# Patient Record
Sex: Female | Born: 1955 | Race: White | Hispanic: No | Smoking: Current every day smoker
Health system: Southern US, Community
[De-identification: ages and names within clinical notes are randomized; demographics above are authoritative.]

## PROBLEM LIST (undated history)

## (undated) DIAGNOSIS — Z923 Personal history of irradiation: Secondary | ICD-10-CM

## (undated) DIAGNOSIS — Z86000 Personal history of in-situ neoplasm of breast: Secondary | ICD-10-CM

## (undated) DIAGNOSIS — F419 Anxiety disorder, unspecified: Secondary | ICD-10-CM

## (undated) DIAGNOSIS — J342 Deviated nasal septum: Secondary | ICD-10-CM

## (undated) DIAGNOSIS — M48061 Spinal stenosis, lumbar region without neurogenic claudication: Secondary | ICD-10-CM

## (undated) DIAGNOSIS — J324 Chronic pansinusitis: Secondary | ICD-10-CM

## (undated) DIAGNOSIS — I10 Essential (primary) hypertension: Secondary | ICD-10-CM

## (undated) DIAGNOSIS — K5790 Diverticulosis of intestine, part unspecified, without perforation or abscess without bleeding: Secondary | ICD-10-CM

## (undated) DIAGNOSIS — K219 Gastro-esophageal reflux disease without esophagitis: Secondary | ICD-10-CM

## (undated) DIAGNOSIS — Z800 Family history of malignant neoplasm of digestive organs: Secondary | ICD-10-CM

## (undated) DIAGNOSIS — G2581 Restless legs syndrome: Secondary | ICD-10-CM

## (undated) HISTORY — DX: Family history of malignant neoplasm of digestive organs: Z80.0

## (undated) HISTORY — DX: Essential (primary) hypertension: I10

## (undated) HISTORY — DX: Anxiety disorder, unspecified: F41.9

## (undated) HISTORY — DX: Personal history of in-situ neoplasm of breast: Z86.000

## (undated) HISTORY — DX: Gastro-esophageal reflux disease without esophagitis: K21.9

## (undated) HISTORY — DX: Deviated nasal septum: J34.2

## (undated) HISTORY — DX: Spinal stenosis, lumbar region without neurogenic claudication: M48.061

## (undated) HISTORY — DX: Chronic pansinusitis: J32.4

## (undated) HISTORY — DX: Diverticulosis of intestine, part unspecified, without perforation or abscess without bleeding: K57.90

## (undated) HISTORY — PX: WISDOM TOOTH EXTRACTION: SHX21

## (undated) HISTORY — DX: Restless legs syndrome: G25.81

---

## 2007-01-22 ENCOUNTER — Encounter: Admission: RE | Admit: 2007-01-22 | Discharge: 2007-01-22 | Payer: Self-pay | Admitting: Unknown Physician Specialty

## 2010-10-23 ENCOUNTER — Encounter: Payer: Self-pay | Admitting: Unknown Physician Specialty

## 2014-07-14 DIAGNOSIS — G2581 Restless legs syndrome: Secondary | ICD-10-CM | POA: Insufficient documentation

## 2014-07-14 DIAGNOSIS — G4700 Insomnia, unspecified: Secondary | ICD-10-CM | POA: Insufficient documentation

## 2014-07-14 DIAGNOSIS — K219 Gastro-esophageal reflux disease without esophagitis: Secondary | ICD-10-CM | POA: Insufficient documentation

## 2014-07-14 DIAGNOSIS — J342 Deviated nasal septum: Secondary | ICD-10-CM | POA: Insufficient documentation

## 2014-10-29 ENCOUNTER — Other Ambulatory Visit: Payer: Self-pay

## 2014-10-29 DIAGNOSIS — Z1231 Encounter for screening mammogram for malignant neoplasm of breast: Secondary | ICD-10-CM

## 2014-11-04 ENCOUNTER — Ambulatory Visit: Payer: Self-pay

## 2014-11-11 ENCOUNTER — Ambulatory Visit
Admission: RE | Admit: 2014-11-11 | Discharge: 2014-11-11 | Disposition: A | Discharge status: Home / Self Care | Payer: 59 | Source: Ambulatory Visit

## 2014-11-11 DIAGNOSIS — Z1231 Encounter for screening mammogram for malignant neoplasm of breast: Secondary | ICD-10-CM

## 2015-11-04 DIAGNOSIS — I10 Essential (primary) hypertension: Secondary | ICD-10-CM | POA: Insufficient documentation

## 2016-03-27 ENCOUNTER — Encounter: Payer: Self-pay | Admitting: Physician Assistant

## 2016-03-27 ENCOUNTER — Ambulatory Visit (INDEPENDENT_AMBULATORY_CARE_PROVIDER_SITE_OTHER): Payer: BLUE CROSS/BLUE SHIELD | Admitting: Physician Assistant

## 2016-03-27 VITALS — BP 132/78 | HR 57 | Temp 97.8°F | Ht 62.0 in | Wt 163.8 lb

## 2016-03-27 DIAGNOSIS — J0100 Acute maxillary sinusitis, unspecified: Secondary | ICD-10-CM | POA: Diagnosis not present

## 2016-03-27 MED ORDER — AMOXICILLIN-POT CLAVULANATE 875-125 MG PO TABS: 1.0000 | ORAL_TABLET | Freq: Two times a day (BID) | ORAL | Status: DC

## 2016-03-27 NOTE — Patient Instructions (Signed)

## 2016-03-27 NOTE — Progress Notes (Signed)
Subjective:     Patient ID: Cathy Goodwin, female   DOB: 1956/09/14, 60 y.o.   MRN: [id number]  HPI Pt with sinus pain and pressure for 2 weeks Uses Flonase in Sinu-Rinse on a regular basis  Review of Systems  Constitutional: Negative.   HENT: Positive for congestion, postnasal drip and sinus pressure. Negative for ear discharge, ear pain, nosebleeds, rhinorrhea, sneezing, sore throat, trouble swallowing and voice change.   Respiratory: Positive for cough. Negative for shortness of breath and wheezing.        Objective:   Physical Exam  Constitutional: She appears well-developed and well-nourished.  HENT:  Right Ear: External ear normal.  Left Ear: External ear normal.  Mouth/Throat: Oropharynx is clear and moist. No oropharyngeal exudate.  + frontal and maxillary sinus TTP  Neck: Neck supple.  Cardiovascular: Normal rate, regular rhythm and normal heart sounds.   Pulmonary/Chest: Effort normal and breath sounds normal. No respiratory distress. She has no wheezes. She has no rales. She exhibits no tenderness.  Lymphadenopathy:    She has no cervical adenopathy.  Nursing note and vitals reviewed.      Assessment:     1. Acute maxillary sinusitis, recurrence not specified        Plan:     Augmentin bid x 2 weeks since more chronic in nature Can continue with Flonase and saline rinse Fluids Rest F/U prn

## 2016-04-11 ENCOUNTER — Telehealth: Payer: Self-pay | Admitting: Physician Assistant

## 2016-04-12 ENCOUNTER — Ambulatory Visit (INDEPENDENT_AMBULATORY_CARE_PROVIDER_SITE_OTHER): Payer: BLUE CROSS/BLUE SHIELD | Admitting: Pediatrics

## 2016-04-12 ENCOUNTER — Encounter: Payer: Self-pay | Admitting: Pediatrics

## 2016-04-12 VITALS — BP 131/86 | HR 76 | Temp 97.1°F | Ht 62.0 in | Wt 160.8 lb

## 2016-04-12 DIAGNOSIS — I10 Essential (primary) hypertension: Secondary | ICD-10-CM | POA: Diagnosis not present

## 2016-04-12 DIAGNOSIS — J329 Chronic sinusitis, unspecified: Secondary | ICD-10-CM | POA: Diagnosis not present

## 2016-04-12 DIAGNOSIS — Z6829 Body mass index (BMI) 29.0-29.9, adult: Secondary | ICD-10-CM | POA: Diagnosis not present

## 2016-04-12 MED ORDER — FLUTICASONE PROPIONATE 50 MCG/ACT NA SUSP: 1.0000 | Freq: Two times a day (BID) | NASAL | Status: DC

## 2016-04-12 MED ORDER — LEVOFLOXACIN 500 MG PO TABS: 500.0000 mg | ORAL_TABLET | Freq: Every day | ORAL | Status: DC

## 2016-04-12 MED ORDER — AZELASTINE HCL 0.15 % NA SOLN: 2.0000 | Freq: Two times a day (BID) | NASAL | Status: DC

## 2016-04-12 NOTE — Patient Instructions (Signed)
Azelastine spray twice a day Flonase twice a day Take levofloxacin once a day for 7 days Use sinus rinses at least twice a day Use nose sprays AFTER sinus rinses

## 2016-04-12 NOTE — Progress Notes (Signed)
    Subjective:    Patient ID: Cathy Goodwin, female    DOB: 04/10/1956, 60 y.o.   MRN: [id number]  CC: Nasal Congestion; Sinus pressure; and Fatigue   HPI: Cathy Goodwin is a 60 y.o. female presenting for Nasal Congestion; Sinus pressure; and Fatigue  Just finished two weeks of augmentin for chronic sinusitis Denies fevers Abx helped at first, now pressure in sinuses is back Doing sinus rinses 1-2 times a day Now nothing seems to help with pain Pain worse when she leans forwards dymista spray has helped in the past Ear pressure comes and goes No sore throat Appetite normal   Depression screen [facility] 2/9 04/12/2016 03/27/2016  Decreased Interest 1 0  Down, Depressed, Hopeless 0 1  PHQ - 2 Score 1 1     Relevant past medical, surgical, family and social history reviewed and updated as indicated.  Interim medical history since our last visit reviewed. Allergies and medications reviewed and updated.  ROS: Per HPI unless specifically indicated above  History  Smoking status  . Not on file  Smokeless tobacco  . Not on file       Objective:    BP 131/86 mmHg  Pulse 76  Temp(Src) 97.1 F (36.2 C) (Oral)  Ht 5' 2"  (1.575 m)  Wt 160 lb 12.8 oz (72.938 kg)  BMI 29.40 kg/m2  Wt Readings from Last 3 Encounters:  04/12/16 160 lb 12.8 oz (72.938 kg)  03/27/16 163 lb 12.8 oz (74.299 kg)    Gen: NAD, alert, cooperative with exam, NCAT EYES: EOMI, no scleral injection or icterus ENT:  TMs pearly gray b/l, OP without erythema, tender over max and frontal sinuses b/l LYMPH: no cervical LAD CV: NRRR, normal S1/S2, no murmur, distal pulses 2+ b/l Resp: CTABL, no wheezes, normal WOB Ext: No edema, warm Neuro: Alert and oriented     Assessment & Plan:    Lessa was seen today for nasal congestion, sinus pressure and fatigue.  Diagnoses and all orders for this visit:  Recurrent sinusitis -     levofloxacin (LEVAQUIN) 500 MG tablet; Take 1 tablet (500 mg total) by mouth  daily. -     Azelastine HCl 0.15 % SOLN; Place 2 sprays into the nose 2 (two) times daily. -     fluticasone (FLONASE) 50 MCG/ACT nasal spray; Place 1 spray into both nostrils 2 (two) times daily.  BMI 29.0-29.9,adult  Essential (primary) hypertension Adequate control, cont current meds  Patient Instructions Azelastine spray twice a day Flonase twice a day Take levofloxacin once a day for 7 days Use sinus rinses at least twice a day Use nose sprays AFTER sinus rinses  Follow up plan: Return in about 1 week (around 04/19/2016), or if symptoms worsen or fail to improve. consider imaging  Assunta Found, MD [facility] 04/12/2016, 2:07 PM

## 2016-04-20 ENCOUNTER — Ambulatory Visit: Payer: BLUE CROSS/BLUE SHIELD | Admitting: Pediatrics

## 2016-11-21 ENCOUNTER — Emergency Department (HOSPITAL_BASED_OUTPATIENT_CLINIC_OR_DEPARTMENT_OTHER)
Admission: EM | Admit: 2016-11-21 | Discharge: 2016-11-21 | Disposition: A | Discharge status: Home / Self Care | Payer: BLUE CROSS/BLUE SHIELD | Attending: Emergency Medicine | Admitting: Emergency Medicine

## 2016-11-21 ENCOUNTER — Encounter (HOSPITAL_BASED_OUTPATIENT_CLINIC_OR_DEPARTMENT_OTHER): Payer: Self-pay

## 2016-11-21 DIAGNOSIS — Z79899 Other long term (current) drug therapy: Secondary | ICD-10-CM | POA: Insufficient documentation

## 2016-11-21 DIAGNOSIS — M545 Low back pain, unspecified: Secondary | ICD-10-CM

## 2016-11-21 DIAGNOSIS — I10 Essential (primary) hypertension: Secondary | ICD-10-CM | POA: Diagnosis not present

## 2016-11-21 DIAGNOSIS — R109 Unspecified abdominal pain: Secondary | ICD-10-CM | POA: Insufficient documentation

## 2016-11-21 DIAGNOSIS — F17210 Nicotine dependence, cigarettes, uncomplicated: Secondary | ICD-10-CM | POA: Insufficient documentation

## 2016-11-21 LAB — URINALYSIS, ROUTINE W REFLEX MICROSCOPIC
Bilirubin Urine: NEGATIVE
GLUCOSE, UA: NEGATIVE mg/dL
HGB URINE DIPSTICK: NEGATIVE
KETONES UR: NEGATIVE mg/dL
Leukocytes, UA: NEGATIVE
Nitrite: NEGATIVE
PROTEIN: NEGATIVE mg/dL
Specific Gravity, Urine: 1.019 (ref 1.005–1.030)
pH: 7.5 (ref 5.0–8.0)

## 2016-11-21 MED ORDER — BACLOFEN 10 MG PO TABS: 10.0000 mg | ORAL_TABLET | Freq: Three times a day (TID) | ORAL | 0 refills | Status: DC

## 2016-11-21 MED ORDER — MELOXICAM 15 MG PO TABS: 15.0000 mg | ORAL_TABLET | Freq: Every day | ORAL | 0 refills | Status: DC

## 2016-11-21 NOTE — ED Provider Notes (Signed)
[facility] Provider Note   CSN: [id number] Arrival date & time: 11/21/16  1351   By signing my name below, I, Evelene Croon, attest that this documentation has been prepared under the direction and in the presence of Margarita Mail, PA-C. Electronically Signed: Evelene Croon, Scribe. 11/21/2016. 4:50 PM.  History   Chief Complaint Chief Complaint  Patient presents with  . Back Pain    The history is provided by the patient. No language interpreter was used.     HPI Comments:  Cathy Goodwin is a 61 y.o. female with a h/o back pain  who presents to the Emergency Department complaining of 6/10 burning pain from left back region around to abdomen since yesterday. Her pain waxes and wanes in severity  She denies h/o kidney stones Pain today is dissimilar to her usual back pain.  No modifying factors. No urinary symptoms or fever.  Past Medical History:  Diagnosis Date  . Hypertension     Patient Active Problem List   Diagnosis Date Noted  . BMI 29.0-29.9,adult 04/12/2016  . Essential (primary) hypertension 11/04/2015  . Restless leg 07/14/2014  . Cannot sleep 07/14/2014  . Acid reflux 07/14/2014  . Deflected nasal septum 07/14/2014    Past Surgical History:  Procedure Laterality Date  . WISDOM TOOTH EXTRACTION      OB History    No data available       Home Medications    Prior to Admission medications   Medication Sig Start Date End Date Taking? Authorizing Provider  ALPRAZolam Duanne Moron) 0.5 MG tablet Take 0.5 mg by mouth as needed for anxiety.   Yes Historical Provider, MD  losartan-hydrochlorothiazide (HYZAAR) 50-12.5 MG tablet Take 1 tablet by mouth daily.   Yes Historical Provider, MD  zolpidem (AMBIEN) 10 MG tablet Take 10 mg by mouth at bedtime as needed for sleep.   Yes Historical Provider, MD  Azelastine HCl 0.15 % SOLN Place 2 sprays into the nose 2 (two) times daily. 04/12/16   Eustaquio Maize, MD  fluticasone (FLONASE) 50 MCG/ACT nasal  spray Place 1 spray into both nostrils 2 (two) times daily. 04/12/16   Eustaquio Maize, MD  levofloxacin (LEVAQUIN) 500 MG tablet Take 1 tablet (500 mg total) by mouth daily. 04/12/16   Eustaquio Maize, MD    Family History Family History  Problem Relation Age of Onset  . Heart disease Mother   . Cancer Father     colon, prostate  . Heart disease Father   . Hypertension Sister     Social History Social History  Substance Use Topics  . Smoking status: Current Every Day Smoker    Packs/day: 0.50    Types: Cigarettes  . Smokeless tobacco: Never Used  . Alcohol use Yes     Comment: occ     Allergies   Patient has no known allergies.   Review of Systems Review of Systems  Constitutional: Negative for fever.  Gastrointestinal: Positive for abdominal pain.  Genitourinary: Negative for dysuria and hematuria.  Musculoskeletal: Positive for back pain.     Physical Exam Updated Vital Signs BP 148/81 (BP Location: Right Arm)   Pulse 79   Temp 98.3 F (36.8 C) (Oral)   Resp 20   Ht 5' 1"  (1.549 m)   Wt 160 lb (72.6 kg)   SpO2 100%   BMI 30.23 kg/m   Physical Exam  Constitutional: She is oriented to person, place, and time. She appears well-developed and well-nourished. No distress.  HENT:  Head: Normocephalic and atraumatic.  Eyes: Conjunctivae are normal.  Cardiovascular: Normal rate.   Pulmonary/Chest: Effort normal.  Abdominal: She exhibits no distension.  No CVA tenderness  Musculoskeletal:  Tenderness to left paraspinal muscles worse with twisting movement  Normal strength and sensation in the extremities   Neurological: She is alert and oriented to person, place, and time.  Skin: Skin is warm and dry.  Psychiatric: She has a normal mood and affect.  Nursing note and vitals reviewed.    ED Treatments / Results  DIAGNOSTIC STUDIES:  Oxygen Saturation is 100% on RA, normal by my interpretation.    COORDINATION OF CARE:  4:48 PM Discussed treatment  plan with pt at bedside and pt agreed to plan.  Labs (all labs ordered are listed, but only abnormal results are displayed) Labs Reviewed - No data to display  EKG  EKG Interpretation None       Radiology No results found.  Procedures Procedures (including critical care time)  Medications Ordered in ED Medications - No data to display   Initial Impression / Assessment and Plan / ED Course  I have reviewed the triage vital signs and the nursing notes.  Pertinent labs & imaging results that were available during my care of the patient were reviewed by me and considered in my medical decision making (see chart for details).     Patient with back pain.  No neurological deficits and normal neuro exam.  Patient can walk but states is painful.  No loss of bowel or bladder control.  No concern for cauda equina.  No fever, night sweats, weight loss, h/o cancer, IVDU.  RICE protocol and pain medicine indicated and discussed with patient.    Final Clinical Impressions(s) / ED Diagnoses   Final diagnoses:  Acute bilateral low back pain without sciatica    New Prescriptions New Prescriptions   No medications on file   I personally performed the services described in this documentation, which was scribed in my presence. The recorded information has been reviewed and is accurate.        Margarita Mail, PA-C 11/21/16 1953    Dorie Rank, MD 11/23/16 2056

## 2016-11-21 NOTE — ED Triage Notes (Addendum)
CHARTED ON THE WRONG PERSON

## 2016-11-21 NOTE — ED Triage Notes (Signed)
Presents with lower back pain that started a couple days ago.  Denies urinary symptoms.

## 2016-11-21 NOTE — Discharge Instructions (Signed)
YOUR URINE IS NEGATIVE.  SEEK IMMEDIATE MEDICAL ATTENTION IF: New numbness, tingling, weakness, or problem with the use of your arms or legs.  Severe back pain not relieved with medications.  Change in bowel or bladder control.  Increasing pain in any areas of the body (such as chest or abdominal pain).  Shortness of breath, dizziness or fainting.  Nausea (feeling sick to your stomach), vomiting, fever, or sweats.

## 2016-11-22 ENCOUNTER — Ambulatory Visit (INDEPENDENT_AMBULATORY_CARE_PROVIDER_SITE_OTHER): Payer: BLUE CROSS/BLUE SHIELD

## 2016-11-22 ENCOUNTER — Other Ambulatory Visit: Payer: Self-pay | Admitting: Physician Assistant

## 2016-11-22 ENCOUNTER — Encounter: Payer: Self-pay | Admitting: Physician Assistant

## 2016-11-22 ENCOUNTER — Ambulatory Visit (INDEPENDENT_AMBULATORY_CARE_PROVIDER_SITE_OTHER): Payer: BLUE CROSS/BLUE SHIELD | Admitting: Physician Assistant

## 2016-11-22 VITALS — BP 120/76 | HR 76 | Temp 98.0°F | Ht 61.0 in | Wt 158.0 lb

## 2016-11-22 DIAGNOSIS — M545 Low back pain, unspecified: Secondary | ICD-10-CM

## 2016-11-22 MED ORDER — METHYLPREDNISOLONE ACETATE 80 MG/ML IJ SUSP
80.0000 mg | Freq: Once | INTRAMUSCULAR | Status: AC
  Administered 2016-11-22: 80 mg via INTRAMUSCULAR

## 2016-11-22 NOTE — Patient Instructions (Addendum)
Please work on back exercises in a few days/  In a few days you may receive a survey in the mail or online from [employer] regarding your visit with [place] today. Please take a moment to fill this out. Your feedback is very important to our whole office. It can help [place] better understand your needs as well as improve your experience and satisfaction. Thank you for taking your time to complete it. We care about you.  Particia Nearing, PA-C

## 2016-11-22 NOTE — Progress Notes (Signed)
BP 120/76   Pulse 76   Temp 98 F (36.7 C) (Oral)   Ht 5' 1"  (1.549 m)   Wt 158 lb (71.7 kg)   BMI 29.85 kg/m    Subjective:    Patient ID: Cathy Goodwin, female    DOB: [birth year], 61 y.o.   MRN: [id number]  HPI: Cathy Goodwin is a 61 y.o. female presenting on 11/22/2016 for Back Pain (low back pain hurts all the way through)  Four days ago began central lumbar pain that has no radiation. Pain with movement, extension or flexion. Denies urine or bladder symptoms. Was seen through the ED yesterday and urinalysis was good. Denies fever or chills.OTC meds with no relief.  Relevant past medical, surgical, family and social history reviewed and updated as indicated. Allergies and medications reviewed and updated.  Past Medical History:  Diagnosis Date  . Hypertension     Past Surgical History:  Procedure Laterality Date  . WISDOM TOOTH EXTRACTION      Review of Systems  Constitutional: Negative.   HENT: Negative.   Eyes: Negative.   Respiratory: Negative.   Gastrointestinal: Negative.   Genitourinary: Negative.   Musculoskeletal: Positive for back pain and myalgias.    Allergies as of 11/22/2016   No Known Allergies     Medication List       Accurate as of 11/22/16  9:47 PM. Always use your most recent med list.          ALPRAZolam 0.5 MG tablet Commonly known as:  XANAX Take 0.5 mg by mouth as needed for anxiety.   baclofen 10 MG tablet Commonly known as:  LIORESAL Take 1 tablet (10 mg total) by mouth 3 (three) times daily.   fluticasone 50 MCG/ACT nasal spray Commonly known as:  FLONASE Place 1 spray into both nostrils 2 (two) times daily.   losartan-hydrochlorothiazide 50-12.5 MG tablet Commonly known as:  HYZAAR Take 1 tablet by mouth daily.   meloxicam 15 MG tablet Commonly known as:  MOBIC Take 1 tablet (15 mg total) by mouth daily. Take 1 daily with food.   zolpidem 10 MG tablet Commonly known as:  AMBIEN Take 10 mg by mouth at bedtime as  needed for sleep.          Objective:    BP 120/76   Pulse 76   Temp 98 F (36.7 C) (Oral)   Ht 5' 1"  (1.549 m)   Wt 158 lb (71.7 kg)   BMI 29.85 kg/m   No Known Allergies  Physical Exam  Constitutional: She is oriented to person, place, and time. She appears well-developed and well-nourished.  HENT:  Head: Normocephalic and atraumatic.  Eyes: Conjunctivae and EOM are normal. Pupils are equal, round, and reactive to light.  Cardiovascular: Normal rate, regular rhythm, normal heart sounds and intact distal pulses.   Pulmonary/Chest: Effort normal and breath sounds normal.  Abdominal: Soft. Bowel sounds are normal.  Musculoskeletal:       Lumbar back: She exhibits decreased range of motion, tenderness, pain and spasm.  Neurological: She is alert and oriented to person, place, and time. She has normal reflexes.  Skin: Skin is warm and dry. No rash noted.  Psychiatric: She has a normal mood and affect. Her behavior is normal. Judgment and thought content normal.    Results for orders placed or performed during the hospital encounter of 11/21/16  Urinalysis, Routine w reflex microscopic  Result Value Ref Range   Color, Urine YELLOW YELLOW  APPearance CLEAR CLEAR   Specific Gravity, Urine 1.019 1.005 - 1.030   pH 7.5 5.0 - 8.0   Glucose, UA NEGATIVE NEGATIVE mg/dL   Hgb urine dipstick NEGATIVE NEGATIVE   Bilirubin Urine NEGATIVE NEGATIVE   Ketones, ur NEGATIVE NEGATIVE mg/dL   Protein, ur NEGATIVE NEGATIVE mg/dL   Nitrite NEGATIVE NEGATIVE   Leukocytes, UA NEGATIVE NEGATIVE      Assessment & Plan:   1. Acute left-sided low back pain without sciatica - methylPREDNISolone acetate (DEPO-MEDROL) injection 80 mg; Inject 1 mL (80 mg total) into the muscle once. Continue mobic 15 mg Continue baclofen  Continue all other maintenance medications as listed above.  Follow up plan: Return if symptoms worsen or fail to improve.  Educational handout given for lumbar  pain  Terald Sleeper PA-C [place] [address]  [place], [place] [address] [phone]   11/22/2016, 9:47 PM

## 2017-01-30 ENCOUNTER — Other Ambulatory Visit: Payer: Self-pay | Admitting: Pediatrics

## 2017-01-30 DIAGNOSIS — J329 Chronic sinusitis, unspecified: Secondary | ICD-10-CM

## 2017-10-10 ENCOUNTER — Other Ambulatory Visit: Payer: Self-pay

## 2017-10-10 DIAGNOSIS — J329 Chronic sinusitis, unspecified: Secondary | ICD-10-CM

## 2017-10-10 MED ORDER — FLUTICASONE PROPIONATE 50 MCG/ACT NA SUSP: 1.0000 | Freq: Two times a day (BID) | NASAL | 2 refills | Status: AC

## 2018-12-20 LAB — HM HEPATITIS C SCREENING LAB: HM Hepatitis Screen: NEGATIVE

## 2019-07-03 HISTORY — PX: BREAST LUMPECTOMY: SHX2

## 2020-01-02 ENCOUNTER — Ambulatory Visit: Payer: 59

## 2020-01-03 ENCOUNTER — Ambulatory Visit: Payer: 59

## 2020-03-11 LAB — HM PAP SMEAR

## 2020-09-09 DIAGNOSIS — Z03818 Encounter for observation for suspected exposure to other biological agents ruled out: Secondary | ICD-10-CM | POA: Diagnosis not present

## 2020-09-09 DIAGNOSIS — Z20822 Contact with and (suspected) exposure to covid-19: Secondary | ICD-10-CM | POA: Diagnosis not present

## 2020-09-09 DIAGNOSIS — J0140 Acute pansinusitis, unspecified: Secondary | ICD-10-CM | POA: Diagnosis not present

## 2020-12-29 DIAGNOSIS — Z800 Family history of malignant neoplasm of digestive organs: Secondary | ICD-10-CM | POA: Diagnosis not present

## 2020-12-29 DIAGNOSIS — Z8371 Family history of colonic polyps: Secondary | ICD-10-CM | POA: Diagnosis not present

## 2020-12-29 DIAGNOSIS — R109 Unspecified abdominal pain: Secondary | ICD-10-CM | POA: Diagnosis not present

## 2020-12-29 DIAGNOSIS — K219 Gastro-esophageal reflux disease without esophagitis: Secondary | ICD-10-CM | POA: Diagnosis not present

## 2021-01-07 DIAGNOSIS — F5101 Primary insomnia: Secondary | ICD-10-CM | POA: Diagnosis not present

## 2021-01-07 DIAGNOSIS — I10 Essential (primary) hypertension: Secondary | ICD-10-CM | POA: Diagnosis not present

## 2021-01-07 DIAGNOSIS — M545 Low back pain, unspecified: Secondary | ICD-10-CM | POA: Diagnosis not present

## 2021-01-07 DIAGNOSIS — F17210 Nicotine dependence, cigarettes, uncomplicated: Secondary | ICD-10-CM | POA: Diagnosis not present

## 2021-01-07 DIAGNOSIS — M48061 Spinal stenosis, lumbar region without neurogenic claudication: Secondary | ICD-10-CM | POA: Diagnosis not present

## 2021-01-07 DIAGNOSIS — G2581 Restless legs syndrome: Secondary | ICD-10-CM | POA: Diagnosis not present

## 2021-01-07 DIAGNOSIS — E559 Vitamin D deficiency, unspecified: Secondary | ICD-10-CM | POA: Diagnosis not present

## 2021-01-07 DIAGNOSIS — G8929 Other chronic pain: Secondary | ICD-10-CM | POA: Diagnosis not present

## 2021-01-21 ENCOUNTER — Emergency Department (HOSPITAL_COMMUNITY): Payer: BC Managed Care – PPO

## 2021-01-21 ENCOUNTER — Encounter (HOSPITAL_COMMUNITY): Payer: Self-pay

## 2021-01-21 ENCOUNTER — Inpatient Hospital Stay (HOSPITAL_COMMUNITY)
Admission: EM | Admit: 2021-01-21 | Discharge: 2021-01-26 | DRG: 370 | Disposition: A | Discharge status: Home / Self Care | Payer: BC Managed Care – PPO | Attending: Internal Medicine | Admitting: Internal Medicine

## 2021-01-21 DIAGNOSIS — Z1211 Encounter for screening for malignant neoplasm of colon: Secondary | ICD-10-CM | POA: Diagnosis not present

## 2021-01-21 DIAGNOSIS — J982 Interstitial emphysema: Secondary | ICD-10-CM | POA: Diagnosis not present

## 2021-01-21 DIAGNOSIS — E876 Hypokalemia: Secondary | ICD-10-CM | POA: Diagnosis not present

## 2021-01-21 DIAGNOSIS — Z8249 Family history of ischemic heart disease and other diseases of the circulatory system: Secondary | ICD-10-CM | POA: Diagnosis not present

## 2021-01-21 DIAGNOSIS — I10 Essential (primary) hypertension: Secondary | ICD-10-CM | POA: Diagnosis present

## 2021-01-21 DIAGNOSIS — Z800 Family history of malignant neoplasm of digestive organs: Secondary | ICD-10-CM | POA: Diagnosis not present

## 2021-01-21 DIAGNOSIS — R600 Localized edema: Secondary | ICD-10-CM | POA: Diagnosis not present

## 2021-01-21 DIAGNOSIS — Z79899 Other long term (current) drug therapy: Secondary | ICD-10-CM

## 2021-01-21 DIAGNOSIS — K5730 Diverticulosis of large intestine without perforation or abscess without bleeding: Secondary | ICD-10-CM | POA: Diagnosis not present

## 2021-01-21 DIAGNOSIS — K223 Perforation of esophagus: Secondary | ICD-10-CM | POA: Diagnosis not present

## 2021-01-21 DIAGNOSIS — Z20822 Contact with and (suspected) exposure to covid-19: Secondary | ICD-10-CM | POA: Diagnosis present

## 2021-01-21 DIAGNOSIS — F17210 Nicotine dependence, cigarettes, uncomplicated: Secondary | ICD-10-CM | POA: Diagnosis present

## 2021-01-21 DIAGNOSIS — K219 Gastro-esophageal reflux disease without esophagitis: Secondary | ICD-10-CM | POA: Diagnosis present

## 2021-01-21 DIAGNOSIS — R1319 Other dysphagia: Secondary | ICD-10-CM | POA: Diagnosis not present

## 2021-01-21 DIAGNOSIS — K648 Other hemorrhoids: Secondary | ICD-10-CM | POA: Diagnosis not present

## 2021-01-21 DIAGNOSIS — K222 Esophageal obstruction: Secondary | ICD-10-CM | POA: Diagnosis not present

## 2021-01-21 DIAGNOSIS — I517 Cardiomegaly: Secondary | ICD-10-CM | POA: Diagnosis not present

## 2021-01-21 DIAGNOSIS — R109 Unspecified abdominal pain: Secondary | ICD-10-CM | POA: Diagnosis not present

## 2021-01-21 DIAGNOSIS — Z8371 Family history of colonic polyps: Secondary | ICD-10-CM | POA: Diagnosis not present

## 2021-01-21 DIAGNOSIS — K760 Fatty (change of) liver, not elsewhere classified: Secondary | ICD-10-CM | POA: Diagnosis not present

## 2021-01-21 DIAGNOSIS — J9811 Atelectasis: Secondary | ICD-10-CM | POA: Diagnosis not present

## 2021-01-21 DIAGNOSIS — R1013 Epigastric pain: Secondary | ICD-10-CM | POA: Diagnosis not present

## 2021-01-21 LAB — CBC WITH DIFFERENTIAL/PLATELET
Abs Immature Granulocytes: 0.01 10*3/uL (ref 0.00–0.07)
Basophils Absolute: 0 10*3/uL (ref 0.0–0.1)
Basophils Relative: 0 %
Eosinophils Absolute: 0 10*3/uL (ref 0.0–0.5)
Eosinophils Relative: 1 %
HCT: 42.1 % (ref 36.0–46.0)
Hemoglobin: 13.9 g/dL (ref 12.0–15.0)
Immature Granulocytes: 0 %
Lymphocytes Relative: 22 %
Lymphs Abs: 1.1 10*3/uL (ref 0.7–4.0)
MCH: 31.3 pg (ref 26.0–34.0)
MCHC: 33 g/dL (ref 30.0–36.0)
MCV: 94.8 fL (ref 80.0–100.0)
Monocytes Absolute: 0.3 10*3/uL (ref 0.1–1.0)
Monocytes Relative: 6 %
Neutro Abs: 3.4 10*3/uL (ref 1.7–7.7)
Neutrophils Relative %: 71 %
Platelets: 318 10*3/uL (ref 150–400)
RBC: 4.44 MIL/uL (ref 3.87–5.11)
RDW: 13 % (ref 11.5–15.5)
WBC: 4.8 10*3/uL (ref 4.0–10.5)
nRBC: 0 % (ref 0.0–0.2)

## 2021-01-21 LAB — COMPREHENSIVE METABOLIC PANEL
ALT: 27 U/L (ref 0–44)
AST: 23 U/L (ref 15–41)
Albumin: 4.3 g/dL (ref 3.5–5.0)
Alkaline Phosphatase: 84 U/L (ref 38–126)
Anion gap: 9 (ref 5–15)
BUN: 12 mg/dL (ref 8–23)
CO2: 23 mmol/L (ref 22–32)
Calcium: 9.4 mg/dL (ref 8.9–10.3)
Chloride: 108 mmol/L (ref 98–111)
Creatinine, Ser: 0.99 mg/dL (ref 0.44–1.00)
GFR, Estimated: >60 mL/min (ref >60)
Glucose, Bld: 164 mg/dL — ABNORMAL HIGH (ref 70–99)
Potassium: 4.1 mmol/L (ref 3.5–5.1)
Sodium: 140 mmol/L (ref 135–145)
Total Bilirubin: 1.2 mg/dL (ref 0.3–1.2)
Total Protein: 7.8 g/dL (ref 6.5–8.1)

## 2021-01-21 LAB — URINALYSIS, ROUTINE W REFLEX MICROSCOPIC
Bacteria, UA: NONE SEEN
Bilirubin Urine: NEGATIVE
Glucose, UA: NEGATIVE mg/dL
Hgb urine dipstick: NEGATIVE
Ketones, ur: NEGATIVE mg/dL
Leukocytes,Ua: NEGATIVE
Nitrite: NEGATIVE
Protein, ur: 30 mg/dL — AB
Specific Gravity, Urine: 1.02 (ref 1.005–1.030)
pH: 5 (ref 5.0–8.0)

## 2021-01-21 LAB — RESP PANEL BY RT-PCR (FLU A&B, COVID) ARPGX2
Influenza A by PCR: NEGATIVE
Influenza B by PCR: NEGATIVE
SARS Coronavirus 2 by RT PCR: NEGATIVE

## 2021-01-21 LAB — HM COLONOSCOPY

## 2021-01-21 LAB — LIPASE, BLOOD: Lipase: 34 U/L (ref 11–51)

## 2021-01-21 MED ORDER — HYDROMORPHONE HCL 1 MG/ML IJ SOLN
1.0000 mg | Freq: Once | INTRAMUSCULAR | Status: AC
  Administered 2021-01-21: 1 mg via INTRAVENOUS
  Filled 2021-01-21: qty 1

## 2021-01-21 MED ORDER — SODIUM CHLORIDE 0.9% FLUSH: 3.0000 mL | INTRAVENOUS | Status: DC | PRN

## 2021-01-21 MED ORDER — ONDANSETRON HCL 4 MG PO TABS: 4.0000 mg | ORAL_TABLET | Freq: Four times a day (QID) | ORAL | Status: DC | PRN

## 2021-01-21 MED ORDER — SODIUM CHLORIDE 0.9% FLUSH: 3.0000 mL | Freq: Two times a day (BID) | INTRAVENOUS | Status: DC

## 2021-01-21 MED ORDER — SODIUM CHLORIDE 0.9 % IV SOLN: 250.0000 mL | INTRAVENOUS | Status: DC | PRN

## 2021-01-21 MED ORDER — HYDROMORPHONE HCL 1 MG/ML IJ SOLN
0.5000 mg | INTRAMUSCULAR | Status: DC | PRN
  Administered 2021-01-22: 1 mg via INTRAVENOUS
  Filled 2021-01-21: qty 1

## 2021-01-21 MED ORDER — IOHEXOL 300 MG/ML  SOLN
100.0000 mL | Freq: Once | INTRAMUSCULAR | Status: AC | PRN
  Administered 2021-01-21: 100 mL via INTRAVENOUS

## 2021-01-21 MED ORDER — PANTOPRAZOLE SODIUM 40 MG IV SOLR
40.0000 mg | Freq: Once | INTRAVENOUS | Status: AC
  Administered 2021-01-21: 40 mg via INTRAVENOUS
  Filled 2021-01-21: qty 40

## 2021-01-21 MED ORDER — MORPHINE SULFATE (PF) 4 MG/ML IV SOLN
4.0000 mg | Freq: Once | INTRAVENOUS | Status: AC
Start: 2021-01-21 — End: 2021-01-21
  Administered 2021-01-21: 4 mg via INTRAVENOUS
  Filled 2021-01-21: qty 1

## 2021-01-21 MED ORDER — ONDANSETRON HCL 4 MG/2ML IJ SOLN
4.0000 mg | Freq: Once | INTRAMUSCULAR | Status: AC
  Administered 2021-01-21: 4 mg via INTRAVENOUS
  Filled 2021-01-21: qty 2

## 2021-01-21 MED ORDER — IOHEXOL 300 MG/ML  SOLN
30.0000 mL | Freq: Once | INTRAMUSCULAR | Status: AC | PRN
  Administered 2021-01-21: 30 mL via ORAL

## 2021-01-21 MED ORDER — VANCOMYCIN HCL 1500 MG/300ML IV SOLN
1500.0000 mg | Freq: Once | INTRAVENOUS | Status: AC
  Administered 2021-01-21: 1500 mg via INTRAVENOUS
  Filled 2021-01-21: qty 300

## 2021-01-21 MED ORDER — PIPERACILLIN-TAZOBACTAM 3.375 G IVPB 30 MIN
3.3750 g | INTRAVENOUS | Status: AC
  Administered 2021-01-21: 3.375 g via INTRAVENOUS
  Filled 2021-01-21: qty 50

## 2021-01-21 MED ORDER — ONDANSETRON HCL 4 MG/2ML IJ SOLN
4.0000 mg | Freq: Four times a day (QID) | INTRAMUSCULAR | Status: DC | PRN
  Administered 2021-01-21 – 2021-01-22 (×2): 4 mg via INTRAVENOUS
  Filled 2021-01-21 (×2): qty 2

## 2021-01-21 NOTE — Progress Notes (Signed)
A consult was received from an ED physician for vancomycin and Zosyn per pharmacy dosing (for an indication other than meningitis). The patient's profile has been reviewed for ht/wt/allergies/indication/available labs. A one time order has been placed for the above antibiotics.  Further antibiotics/pharmacy consults should be ordered by admitting physician if indicated.                       Reuel Boom, PharmD, BCPS [phone] 01/21/2021, 2:10 PM

## 2021-01-21 NOTE — ED Notes (Signed)
[facility] leaving with pt.

## 2021-01-21 NOTE — H&P (Signed)
History and Physical    Cathy Goodwin IPJ:[id number] DOB: 03-25-1956 DOA: 01/21/2021  PCP: Loraine Leriche., MD  Patient coming from: Home  Chief Complaint: Abdominal pain  HPI: Cathy Goodwin is a 65 y.o. female with medical history significant of hypertension had a colonoscopy and EGD this morning for abdominal pain and routine colon cancer screening was sent home with severe epigastric abdominal pain.  Patient with home was driving in the car when she became passed out was having such extreme pain her husband drove her straight to this emergency department which was the closest emergency department.  Patient found to have an esophageal perforation.  Patient reports that the procedure was done at [facility] at an outside facility and there were several biopsies that were taken.  CT surgery at [facility] has been called are recommending doing a specific CT of the chest and transferring to [facility] for further evaluation.  Review of Systems: As per HPI otherwise 10 point review of systems negative.   Past Medical History:  Diagnosis Date  . Hypertension     Past Surgical History:  Procedure Laterality Date  . WISDOM TOOTH EXTRACTION       reports that she has been smoking cigarettes. She has been smoking about 0.50 packs per day. She has never used smokeless tobacco. She reports current alcohol use. She reports that she does not use drugs.  No Known Allergies  Family History  Problem Relation Age of Onset  . Heart disease Mother   . Cancer Father        colon, prostate  . Heart disease Father   . Hypertension Sister     Prior to Admission medications   Medication Sig Start Date End Date Taking? Authorizing Provider  acetaminophen (TYLENOL) 500 MG tablet Take 500 mg by mouth every 6 (six) hours as needed for moderate pain.   Yes [provider]  ALPRAZolam Duanne Moron) 0.5 MG tablet Take 0.5 mg by mouth at bedtime as needed for anxiety or sleep.   Yes [provider]  azelastine (ASTELIN) 0.1 % nasal spray Place 2 sprays into both nostrils daily as needed for allergies. 12/07/20  Yes [provider]  baclofen (LIORESAL) 10 MG tablet Take 1 tablet (10 mg total) by mouth 3 (three) times daily. Patient taking differently: Take 10 mg by mouth 3 (three) times daily as needed for muscle spasms. 11/21/16  Yes Harris, Abigail, PA-C  famotidine (PEPCID) 20 MG tablet Take 20 mg by mouth at bedtime.   Yes [provider]  fluticasone (FLONASE) 50 MCG/ACT nasal spray Place 1 spray into both nostrils 2 (two) times daily. Patient taking differently: Place 1 spray into both nostrils daily. 10/10/17  Yes Terald Sleeper, PA-C  losartan-hydrochlorothiazide (HYZAAR) 50-12.5 MG tablet Take 1 tablet by mouth daily.   Yes [provider]  meloxicam (MOBIC) 15 MG tablet Take 1 tablet (15 mg total) by mouth daily. Take 1 daily with food. Patient taking differently: Take 15 mg by mouth daily as needed for pain. Take 1 daily with food. 11/21/16  Yes Harris, Abigail, PA-C  montelukast (SINGULAIR) 10 MG tablet Take 1 tablet by mouth daily as needed (allergies). 11/26/20  Yes [provider]  omeprazole (PRILOSEC) 40 MG capsule Take 1 capsule by mouth 2 (two) times daily as needed (indigestion/acid reflux). 12/29/20  Yes [provider]  traMADol (ULTRAM) 50 MG tablet Take 50 mg by mouth 2 (two) times daily as needed for moderate pain. 12/16/20  Yes [provider]  Vitamin D, Ergocalciferol, (DRISDOL) 1.25 MG (50000 UNIT) CAPS capsule Take 1 capsule by mouth once a week. 01/07/21  Yes [provider]  zolpidem (AMBIEN) 10 MG tablet Take 5-10 mg by mouth at bedtime as needed for sleep.   Yes [provider]    Physical Exam: Vitals:   01/21/21 1302 01/21/21 1330 01/21/21 1415 01/21/21 1500  BP: 135/86 130/89 108/81 124/85  Pulse: 78 70 83 86  Resp: 19 (!) 21 18 20   Temp:      TempSrc:      SpO2: 97% 98% 93%  93%  Weight:      Height:          Constitutional: NAD, calm, comfortable Vitals:   01/21/21 1302 01/21/21 1330 01/21/21 1415 01/21/21 1500  BP: 135/86 130/89 108/81 124/85  Pulse: 78 70 83 86  Resp: 19 (!) 21 18 20   Temp:      TempSrc:      SpO2: 97% 98% 93% 93%  Weight:      Height:       Eyes: PERRL, lids and conjunctivae normal ENMT: Mucous membranes are moist. Posterior pharynx clear of any exudate or lesions.Normal dentition.  Neck: normal, supple, no masses, no thyromegaly Respiratory: clear to auscultation bilaterally, no wheezing, no crackles. Normal respiratory effort. No accessory muscle use.  Cardiovascular: Regular rate and rhythm, no murmurs / rubs / gallops. No extremity edema. 2+ pedal pulses. No carotid bruits.  Abdomen: no tenderness, no masses palpated. No hepatosplenomegaly. Bowel sounds positive.  Musculoskeletal: no clubbing / cyanosis. No joint deformity upper and lower extremities. Good ROM, no contractures. Normal muscle tone.  Skin: no rashes, lesions, ulcers. No induration Neurologic: CN 2-12 grossly intact. Sensation intact, DTR normal. Strength 5/5 in all 4.  Psychiatric: Normal judgment and insight. Alert and oriented x 3. Normal mood.    Labs on Admission: I have personally reviewed following labs and imaging studies  CBC: Recent Labs  Lab 01/21/21 0952  WBC 4.8  NEUTROABS 3.4  HGB 13.9  HCT 42.1  MCV 94.8  PLT 832   Basic Metabolic Panel: Recent Labs  Lab 01/21/21 0952  NA 140  K 4.1  CL 108  CO2 23  GLUCOSE 164*  BUN 12  CREATININE 0.99  CALCIUM 9.4   GFR: Estimated Creatinine Clearance: 56.7 mL/min (by C-G formula based on SCr of 0.99 mg/dL). Liver Function Tests: Recent Labs  Lab 01/21/21 0952  AST 23  ALT 27  ALKPHOS 84  BILITOT 1.2  PROT 7.8  ALBUMIN 4.3   Recent Labs  Lab 01/21/21 0952  LIPASE 34   No results for input(s): AMMONIA in the last 168 hours. Coagulation Profile: No results for input(s): INR,  PROTIME in the last 168 hours. Cardiac Enzymes: No results for input(s): CKTOTAL, CKMB, CKMBINDEX, TROPONINI in the last 168 hours. BNP (last 3 results) No results for input(s): PROBNP in the last 8760 hours. HbA1C: No results for input(s): HGBA1C in the last 72 hours. CBG: No results for input(s): GLUCAP in the last 168 hours. Lipid Profile: No results for input(s): CHOL, HDL, LDLCALC, TRIG, CHOLHDL, LDLDIRECT in the last 72 hours. Thyroid Function Tests: No results for input(s): TSH, T4TOTAL, FREET4, T3FREE, THYROIDAB in the last 72 hours. Anemia Panel: No results for input(s): VITAMINB12, FOLATE, FERRITIN, TIBC, IRON, RETICCTPCT in the last 72 hours. Urine analysis:    Component Value Date/Time   COLORURINE YELLOW 01/21/2021 [place] 01/21/2021 1258  LABSPEC 1.020 01/21/2021 1258   PHURINE 5.0 01/21/2021 1258   GLUCOSEU NEGATIVE 01/21/2021 1258   [place] 01/21/2021 [place] 01/21/2021 1258   [place] 01/21/2021 1258   PROTEINUR 30 (A) 01/21/2021 1258   NITRITE NEGATIVE 01/21/2021 1258   LEUKOCYTESUR NEGATIVE 01/21/2021 1258   Sepsis Labs: !!!!!!!!!!!!!!!!!!!!!!!!!!!!!!!!!!!!!!!!!!!! @LABRCNTIP (procalcitonin:4,lacticidven:4) )No results found for this or any previous visit (from the past 240 hour(s)).   Radiological Exams on Admission: CT ABDOMEN PELVIS W CONTRAST  Result Date: 01/21/2021 CLINICAL DATA:  Abdominal pain following upper endoscopy and colonoscopy 1 day ago EXAM: CT ABDOMEN AND PELVIS WITH CONTRAST TECHNIQUE: Multidetector CT imaging of the abdomen and pelvis was performed using the standard protocol following bolus administration of intravenous contrast. CONTRAST:  19m OMNIPAQUE IOHEXOL 300 MG/ML  SOLN COMPARISON:  None. FINDINGS: Lower chest: Fluid present within the posterior mediastinum adjacent to the distal esophagus where there is also a small amount of extraluminal air (series 2, images 12-15). Mild  streaky dependent bibasilar opacities, likely atelectasis. Heart size within normal limits. Hepatobiliary: Mildly decreased attenuation of the hepatic parenchyma suggesting hepatic steatosis. No focal hepatic lesion identified. Unremarkable gallbladder. No hyperdense gallstone. No biliary dilatation. Pancreas: Unremarkable. No pancreatic ductal dilatation or surrounding inflammatory changes. Spleen: Normal in size without focal abnormality. Adrenals/Urinary Tract: Unremarkable adrenal glands. Kidneys enhance symmetrically without focal lesion, stone, or hydronephrosis. Ureters are nondilated. Urinary bladder is decompressed, limiting its evaluation. Stomach/Bowel: Stomach is unremarkable. No dilated loops of small bowel to suggest obstruction. Diverticulosis predominantly involving the sigmoid colon. Submucosal fat deposition is present within the cecum and ascending colon. Mild long segment thickening within the sigmoid colon, which may be reactive secondary to recent colonoscopy. No pericolonic inflammatory changes. Normal appendix is present in the right lower quadrant (series 5, image 92) Vascular/Lymphatic: No significant vascular findings are present. No enlarged abdominal or pelvic lymph nodes. Reproductive: Uterus and bilateral adnexa are unremarkable. Other: No free fluid. No abdominopelvic fluid collection. Single focus of intraperitoneal air adjacent to the esophageal hiatus (series 4, image 43. No gross pneumoperitoneum. No abdominal wall hernia. Musculoskeletal: No acute or significant osseous findings. IMPRESSION: 1. Fluid and a small amount of extraluminal air within the posterior mediastinum adjacent to the distal esophagus. Findings indicative of an esophageal perforation. 2. No gross free intraperitoneal air. 3. Mild long segment thickening within the sigmoid colon, which may be reactive secondary to recent colonoscopy. No evidence of large bowel perforation. 4. Submucosal fat deposition within the  cecum and ascending colon, which can be seen with chronic inflammatory bowel disease. 5. Hepatic steatosis. These results were called by telephone at the time of interpretation on 01/21/2021 at 1:22 pm to provider CLAUDIA GIBBONS , who verbally acknowledged these results. Electronically Signed   By: NDavina PokeD.O.   On: 01/21/2021 13:24   DG Abd Acute W/Chest  Result Date: 01/21/2021 CLINICAL DATA:  Epigastric pain after EGD and colonoscopy EXAM: DG ABDOMEN ACUTE WITH 1 VIEW CHEST COMPARISON:  None. FINDINGS: Under penetrated study. No visible pneumoperitoneum or pneumatosis. Low volume chest with interstitial crowding. There is no edema, consolidation, effusion, or pneumothorax. Normal heart size and mediastinal contours. No abnormal gas distension. No concerning mass effect or calcification. IMPRESSION: Unremarkable portable study. No visible pneumoperitoneum or excessive gas retention. Electronically Signed   By: JMonte FantasiaM.D.   On: 01/21/2021 10:54    Old chart reviewed Case discussed with EDP  Assessment/Plan  65year old female had a colonoscopy and EGD this morning  comes in with esophageal perforation  Principal Problem:    Esophageal perforation-keep n.p.o.  Transfer to [facility] where CT surgery is.  Obtain CT of the chest to further clarify severity of perforation.  Hold anticoagulants and antiplatelet treatment at this time until surgical plan were clear.    Active Problems:    Essential (primary) hypertension-hold blood pressure medications at this time.    Further recommendations pending on overall hospital course   DVT prophylaxis: SCDs only Code Status: Full Family Communication: None Disposition Plan: Days Consults called: CT surgery Admission status: Admit to Benjiman Core A MD [facility]  If 7PM-7AM, please contact night-coverage [email or web address] Password [facility]  01/21/2021, 3:34 PM

## 2021-01-21 NOTE — ED Notes (Signed)
Report called to [facility] 5N RN.

## 2021-01-21 NOTE — ED Notes (Signed)
Patient attached to external female catheter

## 2021-01-21 NOTE — ED Triage Notes (Signed)
Pt states she had a colonoscopy and an endoscopy this morning. Now reports having nausea and vomiting along with chest pain. States she had seizure like activity on the way here.

## 2021-01-21 NOTE — ED Provider Notes (Signed)
[facility] DEPT Provider Note   CSN: [id number] Arrival date & time: 01/21/21  0350     History Chief Complaint  Patient presents with  . Emesis    Cathy Goodwin is a 65 y.o. female with past medical history of GERD presents to the ED for evaluation of abdominal pain.  Located in the epigastrium, nonradiating.  She had a upper EGD and colonoscopy this morning.  States when she woke up from the procedures she noticed epigastric discomfort that was milder.  States she was burping a lot.  It kind of felt like her acid reflux.  She mentioned it to the staff but states they did not address it.  By the time she was in the car driving home husband states she all of a sudden had a "seizure.  Describes it as patient's eyes were open, "bulging out". She wasn't moving, eyes open but she wasn't responding.  She then "came to" and said "I passed out didn't I" and vomited forcefully several times.  Patient states she urinated on herself and lost control of her bowels while she was forcefully vomiting.  Her epigastric abdominal pain has been worse since.  Reports as severe, constant, "deep".  It is worse when she moves, takes deep breaths or presses the area.  Is still nauseated.  Denies any hematemesis, coffee-ground emesis.  No radiation of pain to the chest, back.  Some shortness of breath that she relates to not being able to take deep breaths because it hurts.  She has had to do the bowel prep for colonoscopy and has had looser watery stools.  No history of ulcers.  Has been states the doctor took 2 biopsies from 2 different spots in her stomach.  They do not know the formal results of the upper EGD or colonoscopy.  HPI     Past Medical History:  Diagnosis Date  . Hypertension     Patient Active Problem List   Diagnosis Date Noted  . Acute left-sided low back pain without sciatica 11/22/2016  . BMI 29.0-29.9,adult 04/12/2016  . Essential (primary) hypertension  11/04/2015  . Restless leg 07/14/2014  . Cannot sleep 07/14/2014  . Acid reflux 07/14/2014  . Deflected nasal septum 07/14/2014    Past Surgical History:  Procedure Laterality Date  . WISDOM TOOTH EXTRACTION       OB History   No obstetric history on file.     Family History  Problem Relation Age of Onset  . Heart disease Mother   . Cancer Father        colon, prostate  . Heart disease Father   . Hypertension Sister     Social History   Tobacco Use  . Smoking status: Current Every Day Smoker    Packs/day: 0.50    Types: Cigarettes  . Smokeless tobacco: Never Used  Substance Use Topics  . Alcohol use: Yes    Comment: occ  . Drug use: No    Home Medications Prior to Admission medications   Medication Sig Start Date End Date Taking? Authorizing Provider  acetaminophen (TYLENOL) 500 MG tablet Take 500 mg by mouth every 6 (six) hours as needed for moderate pain.   Yes [provider]  ALPRAZolam Duanne Moron) 0.5 MG tablet Take 0.5 mg by mouth at bedtime as needed for anxiety or sleep.   Yes [provider]  azelastine (ASTELIN) 0.1 % nasal spray Place 2 sprays into both nostrils daily as needed for allergies. 12/07/20  Yes  [provider]  baclofen (LIORESAL) 10 MG tablet Take 1 tablet (10 mg total) by mouth 3 (three) times daily. Patient taking differently: Take 10 mg by mouth 3 (three) times daily as needed for muscle spasms. 11/21/16  Yes Harris, Abigail, PA-C  famotidine (PEPCID) 20 MG tablet Take 20 mg by mouth at bedtime.   Yes [provider]  fluticasone (FLONASE) 50 MCG/ACT nasal spray Place 1 spray into both nostrils 2 (two) times daily. Patient taking differently: Place 1 spray into both nostrils daily. 10/10/17  Yes Terald Sleeper, PA-C  losartan-hydrochlorothiazide (HYZAAR) 50-12.5 MG tablet Take 1 tablet by mouth daily.   Yes [provider]  meloxicam (MOBIC) 15 MG tablet Take 1 tablet (15 mg total) by mouth daily. Take 1  daily with food. Patient taking differently: Take 15 mg by mouth daily as needed for pain. Take 1 daily with food. 11/21/16  Yes Harris, Abigail, PA-C  montelukast (SINGULAIR) 10 MG tablet Take 1 tablet by mouth daily as needed (allergies). 11/26/20  Yes [provider]  omeprazole (PRILOSEC) 40 MG capsule Take 1 capsule by mouth 2 (two) times daily as needed (indigestion/acid reflux). 12/29/20  Yes [provider]  traMADol (ULTRAM) 50 MG tablet Take 50 mg by mouth 2 (two) times daily as needed for moderate pain. 12/16/20  Yes [provider]  Vitamin D, Ergocalciferol, (DRISDOL) 1.25 MG (50000 UNIT) CAPS capsule Take 1 capsule by mouth once a week. 01/07/21  Yes [provider]  zolpidem (AMBIEN) 10 MG tablet Take 5-10 mg by mouth at bedtime as needed for sleep.   Yes [provider]    Allergies    Patient has no known allergies.  Review of Systems   Review of Systems  Gastrointestinal: Positive for abdominal pain, nausea and vomiting.  All other systems reviewed and are negative.   Physical Exam Updated Vital Signs BP 108/81   Pulse 83   Temp 97.8 F (36.6 C) (Oral)   Resp 18   Ht 5' 2"  (1.575 m)   Wt 79.4 kg   SpO2 93%   BMI 32.01 kg/m   Physical Exam Vitals and nursing note reviewed.  Constitutional:      Appearance: She is well-developed.     Comments: Non toxic in NAD  HENT:     Head: Normocephalic and atraumatic.     Nose: Nose normal.  Eyes:     Conjunctiva/sclera: Conjunctivae normal.  Cardiovascular:     Rate and Rhythm: Normal rate and regular rhythm.     Pulses:          Radial pulses are 1+ on the right side and 1+ on the left side.       Dorsalis pedis pulses are 1+ on the right side and 1+ on the left side.  Pulmonary:     Effort: Pulmonary effort is normal.     Breath sounds: Normal breath sounds.  Abdominal:     General: Bowel sounds are normal.     Palpations: Abdomen is soft.     Tenderness: There is  abdominal tenderness.     Comments: No G/R/R. No suprapubic or CVA tenderness. Negative Murphy's and McBurney's. Active BS to lower quadrants.   Musculoskeletal:        General: Normal range of motion.     Cervical back: Normal range of motion.  Skin:    General: Skin is warm and dry.     Capillary Refill: Capillary refill takes less than 2  seconds.  Neurological:     Mental Status: She is alert.     Comments: Sensation and strength intact in upper/lower extremities   Psychiatric:        Behavior: Behavior normal.     ED Results / Procedures / Treatments   Labs (all labs ordered are listed, but only abnormal results are displayed) Labs Reviewed  COMPREHENSIVE METABOLIC PANEL - Abnormal; Notable for the following components:      Result Value   Glucose, Bld 164 (*)    All other components within normal limits  URINALYSIS, ROUTINE W REFLEX MICROSCOPIC - Abnormal; Notable for the following components:   Protein, ur 30 (*)    All other components within normal limits  CBC WITH DIFFERENTIAL/PLATELET  LIPASE, BLOOD    EKG None  Radiology CT ABDOMEN PELVIS W CONTRAST  Result Date: 01/21/2021 CLINICAL DATA:  Abdominal pain following upper endoscopy and colonoscopy 1 day ago EXAM: CT ABDOMEN AND PELVIS WITH CONTRAST TECHNIQUE: Multidetector CT imaging of the abdomen and pelvis was performed using the standard protocol following bolus administration of intravenous contrast. CONTRAST:  163m OMNIPAQUE IOHEXOL 300 MG/ML  SOLN COMPARISON:  None. FINDINGS: Lower chest: Fluid present within the posterior mediastinum adjacent to the distal esophagus where there is also a small amount of extraluminal air (series 2, images 12-15). Mild streaky dependent bibasilar opacities, likely atelectasis. Heart size within normal limits. Hepatobiliary: Mildly decreased attenuation of the hepatic parenchyma suggesting hepatic steatosis. No focal hepatic lesion identified. Unremarkable gallbladder. No  hyperdense gallstone. No biliary dilatation. Pancreas: Unremarkable. No pancreatic ductal dilatation or surrounding inflammatory changes. Spleen: Normal in size without focal abnormality. Adrenals/Urinary Tract: Unremarkable adrenal glands. Kidneys enhance symmetrically without focal lesion, stone, or hydronephrosis. Ureters are nondilated. Urinary bladder is decompressed, limiting its evaluation. Stomach/Bowel: Stomach is unremarkable. No dilated loops of small bowel to suggest obstruction. Diverticulosis predominantly involving the sigmoid colon. Submucosal fat deposition is present within the cecum and ascending colon. Mild long segment thickening within the sigmoid colon, which may be reactive secondary to recent colonoscopy. No pericolonic inflammatory changes. Normal appendix is present in the right lower quadrant (series 5, image 92) Vascular/Lymphatic: No significant vascular findings are present. No enlarged abdominal or pelvic lymph nodes. Reproductive: Uterus and bilateral adnexa are unremarkable. Other: No free fluid. No abdominopelvic fluid collection. Single focus of intraperitoneal air adjacent to the esophageal hiatus (series 4, image 43. No gross pneumoperitoneum. No abdominal wall hernia. Musculoskeletal: No acute or significant osseous findings. IMPRESSION: 1. Fluid and a small amount of extraluminal air within the posterior mediastinum adjacent to the distal esophagus. Findings indicative of an esophageal perforation. 2. No gross free intraperitoneal air. 3. Mild long segment thickening within the sigmoid colon, which may be reactive secondary to recent colonoscopy. No evidence of large bowel perforation. 4. Submucosal fat deposition within the cecum and ascending colon, which can be seen with chronic inflammatory bowel disease. 5. Hepatic steatosis. These results were called by telephone at the time of interpretation on 01/21/2021 at 1:22 pm to provider Keaten Mashek , who verbally acknowledged  these results. Electronically Signed   By: NDavina PokeD.O.   On: 01/21/2021 13:24   DG Abd Acute W/Chest  Result Date: 01/21/2021 CLINICAL DATA:  Epigastric pain after EGD and colonoscopy EXAM: DG ABDOMEN ACUTE WITH 1 VIEW CHEST COMPARISON:  None. FINDINGS: Under penetrated study. No visible pneumoperitoneum or pneumatosis. Low volume chest with interstitial crowding. There is no edema, consolidation, effusion, or pneumothorax. Normal heart size and  mediastinal contours. No abnormal gas distension. No concerning mass effect or calcification. IMPRESSION: Unremarkable portable study. No visible pneumoperitoneum or excessive gas retention. Electronically Signed   By: Monte Fantasia M.D.   On: 01/21/2021 10:54    Procedures Procedures   Medications Ordered in ED Medications  vancomycin (VANCOREADY) IVPB 1500 mg/300 mL (has no administration in time range)  morphine 4 MG/ML injection 4 mg (4 mg Intravenous Given 01/21/21 1133)  ondansetron (ZOFRAN) injection 4 mg (4 mg Intravenous Given 01/21/21 1129)  pantoprazole (PROTONIX) injection 40 mg (40 mg Intravenous Given 01/21/21 1135)  iohexol (OMNIPAQUE) 300 MG/ML solution 100 mL (100 mLs Intravenous Contrast Given 01/21/21 1241)  HYDROmorphone (DILAUDID) injection 1 mg (1 mg Intravenous Given 01/21/21 1406)  piperacillin-tazobactam (ZOSYN) IVPB 3.375 g (3.375 g Intravenous New Bag/Given 01/21/21 1421)    ED Course  I have reviewed the triage vital signs and the nursing notes.  Pertinent labs & imaging results that were available during my care of the patient were reviewed by me and considered in my medical decision making (see chart for details).  Clinical Course as of 01/21/21 1506  Fri Jan 21, 2021  1104 RN asked to obtain EKG, give meds.  [CG]  1454 CT ABDOMEN PELVIS W CONTRAST IMPRESSION: 1. Fluid and a small amount of extraluminal air within the posterior mediastinum adjacent to the distal esophagus. Findings indicative of an esophageal  perforation. 2. No gross free intraperitoneal air. 3. Mild long segment thickening within the sigmoid colon, which may be reactive secondary to recent colonoscopy. No evidence of large bowel perforation. 4. Submucosal fat deposition within the cecum and ascending colon, which can be seen with chronic inflammatory bowel disease. 5. Hepatic steatosis. [CG]    Clinical Course User Index [CG] Arlean Hopping   MDM Rules/Calculators/A&P                          64 year old female presents to the ED for sudden onset epigastric abdominal pain with nausea, vomiting after having an endoscopy and colonoscopy earlier this morning.  Has been states that she had esophageal dilation and 2 biopsies of her stomach done.  EMR, triage nurse notes reviewed.  [facility] procedures done [facility].  DDx-high suspicion for esophageal perforation.  Other causes could be gastritis or esophagitis from procedure.  I do not have access to procedure notes.  Labs, imaging ordered by me.  Labs, imaging personally reviewed and interpreted.  Labs reveal-unremarkable.  Lipase, LFTs normal.  Normal WBC, hemoglobin.  Imaging reveals - CTAP shows distal esophageal perforation, no intraperitoneal free air.  Also noted sigmoid colon thickening likely secondary to colonoscopy, not large bowel perforation, submucosal fat deposition in cecum/colon.   Discussed findings with EDP Ray.   Consults - CT surgery spoke to Dr Cyndia Bent asking for CT chest with oral contrast, admission to medicine. CTS will see patient. May need GI involved based on CT findings.    Medicines in the ED - zofran, morphine, dilaudid, zosyn/vanc.    1500: Patient re-evaluated. Remains HD stable, improved epigastric abdominal tenderness, soft, ND. No peritonitis.  Updated patient and husband on POC, they are in agreement.   Final Clinical Impression(s) / ED Diagnoses Final diagnoses:  Esophageal perforation    Rx / DC Orders ED Discharge Orders     None       Kinnie Feil, PA-C 01/21/21 1506    Pattricia Boss, MD 01/24/21 1325

## 2021-01-22 ENCOUNTER — Inpatient Hospital Stay (HOSPITAL_COMMUNITY): Payer: BC Managed Care – PPO

## 2021-01-22 ENCOUNTER — Other Ambulatory Visit: Payer: Self-pay

## 2021-01-22 DIAGNOSIS — K223 Perforation of esophagus: Secondary | ICD-10-CM | POA: Diagnosis not present

## 2021-01-22 LAB — BASIC METABOLIC PANEL
Anion gap: 8 (ref 5–15)
BUN: 11 mg/dL (ref 8–23)
CO2: 25 mmol/L (ref 22–32)
Calcium: 9.1 mg/dL (ref 8.9–10.3)
Chloride: 106 mmol/L (ref 98–111)
Creatinine, Ser: 0.96 mg/dL (ref 0.44–1.00)
GFR, Estimated: >60 mL/min (ref >60)
Glucose, Bld: 130 mg/dL — ABNORMAL HIGH (ref 70–99)
Potassium: 4.7 mmol/L (ref 3.5–5.1)
Sodium: 139 mmol/L (ref 135–145)

## 2021-01-22 LAB — CBC
HCT: 38.6 % (ref 36.0–46.0)
Hemoglobin: 12.7 g/dL (ref 12.0–15.0)
MCH: 31.4 pg (ref 26.0–34.0)
MCHC: 32.9 g/dL (ref 30.0–36.0)
MCV: 95.5 fL (ref 80.0–100.0)
Platelets: 284 10*3/uL (ref 150–400)
RBC: 4.04 MIL/uL (ref 3.87–5.11)
RDW: 13.1 % (ref 11.5–15.5)
WBC: 10.8 10*3/uL — ABNORMAL HIGH (ref 4.0–10.5)
nRBC: 0 % (ref 0.0–0.2)

## 2021-01-22 MED ORDER — ACETAMINOPHEN 325 MG PO TABS
650.0000 mg | ORAL_TABLET | Freq: Four times a day (QID) | ORAL | Status: DC | PRN
  Filled 2021-01-22: qty 2

## 2021-01-22 MED ORDER — PANTOPRAZOLE SODIUM 40 MG IV SOLR
40.0000 mg | Freq: Once | INTRAVENOUS | Status: AC
  Administered 2021-01-22: 40 mg via INTRAVENOUS
  Filled 2021-01-22: qty 40

## 2021-01-22 MED ORDER — DEXTROSE 5 % IV SOLN: INTRAVENOUS | Status: DC

## 2021-01-22 MED ORDER — PANTOPRAZOLE SODIUM 40 MG IV SOLR
40.0000 mg | Freq: Two times a day (BID) | INTRAVENOUS | Status: DC
  Administered 2021-01-22 – 2021-01-24 (×5): 40 mg via INTRAVENOUS
  Filled 2021-01-22 (×5): qty 40

## 2021-01-22 MED ORDER — PIPERACILLIN-TAZOBACTAM 3.375 G IVPB
3.3750 g | Freq: Three times a day (TID) | INTRAVENOUS | Status: DC
  Administered 2021-01-22 – 2021-01-26 (×13): 3.375 g via INTRAVENOUS
  Filled 2021-01-22 (×13): qty 50

## 2021-01-22 NOTE — Progress Notes (Signed)
TCTS Subjective:  Had some nausea overnight that she thinks may be related to Dilaudid she received. Denies abdominal pain. Says she still feels "something" in her epigastrium but it is mild.  Objective: Vital signs in last 24 hours: Temp:  [97.8 F (36.6 C)-98.1 F (36.7 C)] 98.1 F (36.7 C) (04/22 2118) Pulse Rate:  [50-86] 76 (04/22 2118) Resp:  [16-21] 20 (04/22 2118) BP: (108-135)/(64-89) 127/79 (04/22 2118) SpO2:  [93 %-100 %] 96 % (04/22 1900) Weight:  [79.4 kg] 79.4 kg (04/22 0943)  Hemodynamic parameters for last 24 hours:    Intake/Output from previous day: No intake/output data recorded. Intake/Output this shift: No intake/output data recorded.  General appearance: alert and cooperative Heart: regular rate and rhythm Lungs: clear to auscultation bilaterally Abdomen: soft, non-tender; bowel sounds normal  Lab Results: Recent Labs    01/21/21 0952 01/22/21 0221  WBC 4.8 10.8*  HGB 13.9 12.7  HCT 42.1 38.6  PLT 318 284   BMET:  Recent Labs    01/21/21 0952 01/22/21 0221  NA 140 139  K 4.1 4.7  CL 108 106  CO2 23 25  GLUCOSE 164* 130*  BUN 12 11  CREATININE 0.99 0.96  CALCIUM 9.4 9.1    PT/INR: No results for input(s): LABPROT, INR in the last 72 hours. ABG No results found for: PHART, HCO3, TCO2, ACIDBASEDEF, O2SAT CBG (last 3)  No results for input(s): GLUCAP in the last 72 hours.  CXR: clear lungs, no pleural effusions.  Assessment/Plan:  Esophageal perforation after EGD with small amount of mediastinal air but no extravasation on CT with oral contrast. Last temp was last night. WBC 4.8 yest am to 10.8 this am. Continue strict NPO and IV vanc/Zosyn. Follow exam, temp, wbc ct. Will repeat CXR in am. If she remains stable will do GG swallow on Monday. I would avoid narcotics which could cause gut dysmotility and vomiting which we do not want with esophageal perforation.   LOS: 1 day    Cathy Goodwin 01/22/2021

## 2021-01-22 NOTE — Consult Note (Addendum)
[address]       [place] [address]             [phone]      Cardiothoracic Surgery Consultation  Reason for Consult: Esophageal perforation s/p EGD Referring Physician: Dr. Shelly Coss  Cathy Goodwin is an 65 y.o. female.  HPI:   The patient is a 65 year old woman from [place] who underwent colonoscopy and EGD on the morning of 01/21/2021 in [facility] by Dr. Rolm Bookbinder from [facility].  She reports having a history of esophageal reflux and occasional history of objects such as carrots feeling like they are getting stuck in her esophagus.  She has been able to eat things like bread and meat without any difficulty.  She reports occasional heartburn.  She has a family history of colon polyps.  She said that after the procedure yesterday morning when she woke up she had some mild discomfort in her epigastrium.  She reported to the staff and they told her that was common.  She said that her pain worsened during the recovery process which she alerted the staff to but she was discharged with her husband.  She said that on the drive home the pain became severe and she developed dizziness and passed out briefly.  Her husband pulled over and she had to vomit several times.  He brought her to [facility] emergency room because they did not want to return to the endoscopy site in [facility] since they discharged her with abdominal pain.  I reviewed the endoscopy and colonoscopy notes in care everywhere.  It sounds like both procedures were uncomplicated and smooth.  There was no sign of esophageal stricture.  She had biopsies performed of the proximal and distal esophagus and had a routine dilatation with a 17 mm savory dilator over wire with minimal resistance.  Abdominal x-ray in the emergency room is unremarkable.  She had a CT of the abdomen and pelvis with contrast about 2:30 in the afternoon which showed a small amount of fluid and extraluminal air within the  posterior mediastinum adjacent to the distal esophagus suggesting possible esophageal perforation.  There is no intraperitoneal air.  There is mild long segment thickening within the sigmoid colon.  I was called by the emergency room physician around 3:30 in the afternoon with the result which I reviewed.  I requested a CT of the chest with oral contrast to assess for esophageal perforation with extravasation and transferred to [facility] for further evaluation and care.  CT scan of the chest with oral contrast showed some wall thickening of the distal esophagus with a small volume of pneumomediastinum at or just beyond the gastroesophageal junction which was improved from the earlier CT scan.  There was contrast in the esophagus and stomach without extravasation.  There was no pleural effusion.  She remained afebrile with a normal white blood cell count with improvement in her abdominal discomfort.  She was transferred to [facility] last night and I saw her about 10:30 PM in her room on [address].  She reported that her abdominal discomfort was much better but not resolved.  It was limited to the epigastrium.  She said that she did have 1 episode of vomiting since transferred to [facility] and was given a dose of Zofran for that.  Past Medical History:  Diagnosis Date  . Hypertension     Past Surgical History:  Procedure Laterality Date  . WISDOM  TOOTH EXTRACTION      Family History  Problem Relation Age of Onset  . Heart disease Mother   . Cancer Father        colon, prostate  . Heart disease Father   . Hypertension Sister     Social History:  reports that she has been smoking cigarettes. She has been smoking about 0.50 packs per day. She has never used smokeless tobacco. She reports current alcohol use. She reports that she does not use drugs.  Allergies: No Known Allergies  Medications:  I have reviewed the patient's current medications. Prior to Admission:  Medications Prior to Admission   Medication Sig Dispense Refill Last Dose  . acetaminophen (TYLENOL) 500 MG tablet Take 500 mg by mouth every 6 (six) hours as needed for moderate pain.   unknown  . ALPRAZolam (XANAX) 0.5 MG tablet Take 0.5 mg by mouth at bedtime as needed for anxiety or sleep.   01/20/2021 at Unknown time  . azelastine (ASTELIN) 0.1 % nasal spray Place 2 sprays into both nostrils daily as needed for allergies.   01/21/2021 at Unknown time  . baclofen (LIORESAL) 10 MG tablet Take 1 tablet (10 mg total) by mouth 3 (three) times daily. (Patient taking differently: Take 10 mg by mouth 3 (three) times daily as needed for muscle spasms.) 30 each 0 Past Week at Unknown time  . famotidine (PEPCID) 20 MG tablet Take 20 mg by mouth at bedtime.   01/20/2021 at Unknown time  . fluticasone (FLONASE) 50 MCG/ACT nasal spray Place 1 spray into both nostrils 2 (two) times daily. (Patient taking differently: Place 1 spray into both nostrils daily.) 16 g 2 01/21/2021 at Unknown time  . losartan-hydrochlorothiazide (HYZAAR) 50-12.5 MG tablet Take 1 tablet by mouth daily.   01/20/2021 at Unknown time  . meloxicam (MOBIC) 15 MG tablet Take 1 tablet (15 mg total) by mouth daily. Take 1 daily with food. (Patient taking differently: Take 15 mg by mouth daily as needed for pain. Take 1 daily with food.) 10 tablet 0 Past Week at Unknown time  . montelukast (SINGULAIR) 10 MG tablet Take 1 tablet by mouth daily as needed (allergies).   Past Month at Unknown time  . omeprazole (PRILOSEC) 40 MG capsule Take 1 capsule by mouth 2 (two) times daily as needed (indigestion/acid reflux).   01/20/2021 at Unknown time  . traMADol (ULTRAM) 50 MG tablet Take 50 mg by mouth 2 (two) times daily as needed for moderate pain.   01/20/2021 at Unknown time  . Vitamin D, Ergocalciferol, (DRISDOL) 1.25 MG (50000 UNIT) CAPS capsule Take 1 capsule by mouth once a week.   01/17/2021 at unknown time  . zolpidem (AMBIEN) 10 MG tablet Take 5-10 mg by mouth at bedtime as needed  for sleep.   01/20/2021 at Unknown time   Scheduled: . sodium chloride flush  3 mL Intravenous Q12H   Continuous: . sodium chloride    . piperacillin-tazobactam (ZOSYN)  IV 3.375 g (01/22/21 0329)   DJM:EQASTM chloride, HYDROmorphone (DILAUDID) injection, ondansetron **OR** ondansetron (ZOFRAN) IV, sodium chloride flush Anti-infectives (From admission, onward)   Start     Dose/Rate Route Frequency Ordered Stop   01/22/21 0400  piperacillin-tazobactam (ZOSYN) IVPB 3.375 g        3.375 g 12.5 mL/hr over 240 Minutes Intravenous Every 8 hours 01/22/21 0226     01/21/21 1500  vancomycin (VANCOREADY) IVPB 1500 mg/300 mL        1,500 mg 150 mL/hr over  120 Minutes Intravenous  Once 01/21/21 1409 01/21/21 1723   01/21/21 1415  piperacillin-tazobactam (ZOSYN) IVPB 3.375 g        3.375 g 100 mL/hr over 30 Minutes Intravenous STAT 01/21/21 1409 01/21/21 1451      Results for orders placed or performed during the hospital encounter of 01/21/21 (from the past 48 hour(s))  CBC with Differential     Status: None   Collection Time: 01/21/21  9:52 AM  Result Value Ref Range   WBC 4.8 4.0 - 10.5 K/uL   RBC 4.44 3.87 - 5.11 MIL/uL   Hemoglobin 13.9 12.0 - 15.0 g/dL   HCT 42.1 36.0 - 46.0 %   MCV 94.8 80.0 - 100.0 fL   MCH 31.3 26.0 - 34.0 pg   MCHC 33.0 30.0 - 36.0 g/dL   RDW 13.0 11.5 - 15.5 %   Platelets 318 150 - 400 K/uL   nRBC 0.0 0.0 - 0.2 %   Neutrophils Relative % 71 %   Neutro Abs 3.4 1.7 - 7.7 K/uL   Lymphocytes Relative 22 %   Lymphs Abs 1.1 0.7 - 4.0 K/uL   Monocytes Relative 6 %   Monocytes Absolute 0.3 0.1 - 1.0 K/uL   Eosinophils Relative 1 %   Eosinophils Absolute 0.0 0.0 - 0.5 K/uL   Basophils Relative 0 %   Basophils Absolute 0.0 0.0 - 0.1 K/uL   Immature Granulocytes 0 %   Abs Immature Granulocytes 0.01 0.00 - 0.07 K/uL    Comment: Performed at [facility], [place] [address], [place], [place] [address]  Comprehensive metabolic panel     Status:  Abnormal   Collection Time: 01/21/21  9:52 AM  Result Value Ref Range   Sodium 140 135 - 145 mmol/L   Potassium 4.1 3.5 - 5.1 mmol/L   Chloride 108 98 - 111 mmol/L   CO2 23 22 - 32 mmol/L   Glucose, Bld 164 (H) 70 - 99 mg/dL    Comment: Glucose reference range applies only to samples taken after fasting for at least 8 hours.   BUN 12 8 - 23 mg/dL   Creatinine, Ser 0.99 0.44 - 1.00 mg/dL   Calcium 9.4 8.9 - 10.3 mg/dL   Total Protein 7.8 6.5 - 8.1 g/dL   Albumin 4.3 3.5 - 5.0 g/dL   AST 23 15 - 41 U/L   ALT 27 0 - 44 U/L   Alkaline Phosphatase 84 38 - 126 U/L   Total Bilirubin 1.2 0.3 - 1.2 mg/dL   GFR, Estimated >60 >60 mL/min    Comment: (NOTE) Calculated using the CKD-EPI Creatinine Equation (2021)    Anion gap 9 5 - 15    Comment: Performed at [facility], [place] [address] [place], [place] [address]  Lipase, blood     Status: None   Collection Time: 01/21/21  9:52 AM  Result Value Ref Range   Lipase 34 11 - 51 U/L    Comment: Performed at [facility], [place] [address], [place], [place] [address]  Urinalysis, Routine w reflex microscopic Urine, Catheterized     Status: Abnormal   Collection Time: 01/21/21 12:58 PM  Result Value Ref Range   Color, Urine YELLOW YELLOW   APPearance CLEAR CLEAR   Specific Gravity, Urine 1.020 1.005 - 1.030   pH 5.0 5.0 - 8.0   Glucose, UA NEGATIVE NEGATIVE mg/dL   Hgb urine dipstick NEGATIVE NEGATIVE   Bilirubin Urine NEGATIVE NEGATIVE  Ketones, ur NEGATIVE NEGATIVE mg/dL   Protein, ur 30 (A) NEGATIVE mg/dL   Nitrite NEGATIVE NEGATIVE   Leukocytes,Ua NEGATIVE NEGATIVE   RBC / HPF 0-5 0 - 5 RBC/hpf   WBC, UA 0-5 0 - 5 WBC/hpf   Bacteria, UA NONE SEEN NONE SEEN   Squamous Epithelial / LPF 0-5 0 - 5    Comment: Performed at [facility], [place] [address], [place], [place] [address]  Resp Panel by RT-PCR (Flu A&B, Covid) Nasopharyngeal Swab     Status: None   Collection Time: 01/21/21   8:00 PM   Specimen: Nasopharyngeal Swab; Nasopharyngeal(NP) swabs in vial transport medium  Result Value Ref Range   SARS Coronavirus 2 by RT PCR NEGATIVE NEGATIVE    Comment: (NOTE) SARS-CoV-2 target nucleic acids are NOT DETECTED.  The SARS-CoV-2 RNA is generally detectable in upper respiratory specimens during the acute phase of infection. The lowest concentration of SARS-CoV-2 viral copies this assay can detect is 138 copies/mL. A negative result does not preclude SARS-Cov-2 infection and should not be used as the sole basis for treatment or other patient management decisions. A negative result may occur with  improper specimen collection/handling, submission of specimen other than nasopharyngeal swab, presence of viral mutation(s) within the areas targeted by this assay, and inadequate number of viral copies(<138 copies/mL). A negative result must be combined with clinical observations, patient history, and epidemiological information. The expected result is Negative.  Fact Sheet for Patients:  EntrepreneurPulse.com.au  Fact Sheet for Healthcare Providers:  IncredibleEmployment.be  This test is no t yet approved or cleared by the [place] FDA and  has been authorized for detection and/or diagnosis of SARS-CoV-2 by FDA under an Emergency Use Authorization (EUA). This EUA will remain  in effect (meaning this test can be used) for the duration of the COVID-19 declaration under Section 564(b)(1) of the Act, 21 U.S.C.section 360bbb-3(b)(1), unless the authorization is terminated  or revoked sooner.       Influenza A by PCR NEGATIVE NEGATIVE   Influenza B by PCR NEGATIVE NEGATIVE    Comment: (NOTE) The Xpert Xpress SARS-CoV-2/FLU/RSV plus assay is intended as an aid in the diagnosis of influenza from Nasopharyngeal swab specimens and should not be used as a sole basis for treatment. Nasal washings and aspirates are unacceptable for  Xpert Xpress SARS-CoV-2/FLU/RSV testing.  Fact Sheet for Patients: EntrepreneurPulse.com.au  Fact Sheet for Healthcare Providers: IncredibleEmployment.be  This test is not yet approved or cleared by the [place] FDA and has been authorized for detection and/or diagnosis of SARS-CoV-2 by FDA under an Emergency Use Authorization (EUA). This EUA will remain in effect (meaning this test can be used) for the duration of the COVID-19 declaration under Section 564(b)(1) of the Act, 21 U.S.C. section 360bbb-3(b)(1), unless the authorization is terminated or revoked.  Performed at [facility], [place] [address], [place],  [address]   Basic metabolic panel     Status: Abnormal   Collection Time: 01/22/21  2:21 AM  Result Value Ref Range   Sodium 139 135 - 145 mmol/L   Potassium 4.7 3.5 - 5.1 mmol/L   Chloride 106 98 - 111 mmol/L   CO2 25 22 - 32 mmol/L   Glucose, Bld 130 (H) 70 - 99 mg/dL    Comment: Glucose reference range applies only to samples taken after fasting for at least 8 hours.   BUN 11 8 - 23 mg/dL   Creatinine, Ser 0.96 0.44 - 1.00  mg/dL   Calcium 9.1 8.9 - 10.3 mg/dL   GFR, Estimated >60 >60 mL/min    Comment: (NOTE) Calculated using the CKD-EPI Creatinine Equation (2021)    Anion gap 8 5 - 15    Comment: Performed at [facility], [place] [address], [place], [place] [address]  CBC     Status: Abnormal   Collection Time: 01/22/21  2:21 AM  Result Value Ref Range   WBC 10.8 (H) 4.0 - 10.5 K/uL   RBC 4.04 3.87 - 5.11 MIL/uL   Hemoglobin 12.7 12.0 - 15.0 g/dL   HCT 38.6 36.0 - 46.0 %   MCV 95.5 80.0 - 100.0 fL   MCH 31.4 26.0 - 34.0 pg   MCHC 32.9 30.0 - 36.0 g/dL   RDW 13.1 11.5 - 15.5 %   Platelets 284 150 - 400 K/uL   nRBC 0.0 0.0 - 0.2 %    Comment: Performed at [facility], [place] [address], [place], [place] [address]    CT Chest Wo Contrast  Result Date: 01/21/2021 CLINICAL DATA:   Esophageal perforation on CT abdomen/pelvis. Pain after upper endoscopy and colonoscopy 1 day ago. EXAM: CT CHEST WITHOUT CONTRAST TECHNIQUE: Multidetector CT imaging of the chest was performed following the standard protocol without IV contrast. COMPARISON:  Abdominopelvic CT earlier today. FINDINGS: Cardiovascular: Normal heart size. Coronary artery calcifications. No pericardial effusion. Atherosclerosis of the thoracic aorta. No aortic aneurysm. Conventional branching pattern from the aortic arch. Mediastinum/Nodes: Administered enteric contrast opacifies the mid and distal esophagus. There is no evidence of contrast extravasation. Wall thickening of the distal esophagus with small foci of pneumomediastinum at and just beyond the gastroesophageal junction, series 5, images 94 and 100. The degree of pneumomediastinum has improved from abdominal CT earlier today. Small amount of fluid right posterolateral esophagus as seen on CT. No contrast extends into the paraesophageal fluid. No bulky mediastinal or obvious hilar adenopathy. No thyroid nodule. Lungs/Pleura: No pneumothorax. Dependent atelectasis within both lower lobes without confluent consolidation. Minimal right pleural thickening, no significant pleural fluid. Trachea and central bronchi are patent. No pulmonary edema. Calcified granuloma noted in the right middle lobe. Upper Abdomen: Assessed and abdominopelvic CT earlier today. Enteric contrast in the stomach without evidence of gastric extravasation. Excreted IV contrast within both renal collecting systems. Musculoskeletal: There are no acute or suspicious osseous abnormalities. IMPRESSION: 1. No extravasation of enteric contrast from the esophagus. There is wall thickening of the distal esophagus with small volume of pneumomediastinum at and just beyond the gastroesophageal junction, raising concern for esophageal perforation. The degree of pneumomediastinum has improved from CT earlier today. 2.  Dependent atelectasis in both lower lobes. 3. Aortic atherosclerosis.  Coronary artery calcifications. Aortic Atherosclerosis (ICD10-I70.0). Electronically Signed   By: Keith Rake M.D.   On: 01/21/2021 16:21   CT ABDOMEN PELVIS W CONTRAST  Result Date: 01/21/2021 CLINICAL DATA:  Abdominal pain following upper endoscopy and colonoscopy 1 day ago EXAM: CT ABDOMEN AND PELVIS WITH CONTRAST TECHNIQUE: Multidetector CT imaging of the abdomen and pelvis was performed using the standard protocol following bolus administration of intravenous contrast. CONTRAST:  153m OMNIPAQUE IOHEXOL 300 MG/ML  SOLN COMPARISON:  None. FINDINGS: Lower chest: Fluid present within the posterior mediastinum adjacent to the distal esophagus where there is also a small amount of extraluminal air (series 2, images 12-15). Mild streaky dependent bibasilar opacities, likely atelectasis. Heart size within normal limits. Hepatobiliary: Mildly decreased attenuation of the hepatic parenchyma suggesting hepatic steatosis. No  focal hepatic lesion identified. Unremarkable gallbladder. No hyperdense gallstone. No biliary dilatation. Pancreas: Unremarkable. No pancreatic ductal dilatation or surrounding inflammatory changes. Spleen: Normal in size without focal abnormality. Adrenals/Urinary Tract: Unremarkable adrenal glands. Kidneys enhance symmetrically without focal lesion, stone, or hydronephrosis. Ureters are nondilated. Urinary bladder is decompressed, limiting its evaluation. Stomach/Bowel: Stomach is unremarkable. No dilated loops of small bowel to suggest obstruction. Diverticulosis predominantly involving the sigmoid colon. Submucosal fat deposition is present within the cecum and ascending colon. Mild long segment thickening within the sigmoid colon, which may be reactive secondary to recent colonoscopy. No pericolonic inflammatory changes. Normal appendix is present in the right lower quadrant (series 5, image 92) Vascular/Lymphatic:  No significant vascular findings are present. No enlarged abdominal or pelvic lymph nodes. Reproductive: Uterus and bilateral adnexa are unremarkable. Other: No free fluid. No abdominopelvic fluid collection. Single focus of intraperitoneal air adjacent to the esophageal hiatus (series 4, image 43. No gross pneumoperitoneum. No abdominal wall hernia. Musculoskeletal: No acute or significant osseous findings. IMPRESSION: 1. Fluid and a small amount of extraluminal air within the posterior mediastinum adjacent to the distal esophagus. Findings indicative of an esophageal perforation. 2. No gross free intraperitoneal air. 3. Mild long segment thickening within the sigmoid colon, which may be reactive secondary to recent colonoscopy. No evidence of large bowel perforation. 4. Submucosal fat deposition within the cecum and ascending colon, which can be seen with chronic inflammatory bowel disease. 5. Hepatic steatosis. These results were called by telephone at the time of interpretation on 01/21/2021 at 1:22 pm to provider CLAUDIA GIBBONS , who verbally acknowledged these results. Electronically Signed   By: Davina Poke D.O.   On: 01/21/2021 13:24   DG Abd Acute W/Chest  Result Date: 01/21/2021 CLINICAL DATA:  Epigastric pain after EGD and colonoscopy EXAM: DG ABDOMEN ACUTE WITH 1 VIEW CHEST COMPARISON:  None. FINDINGS: Under penetrated study. No visible pneumoperitoneum or pneumatosis. Low volume chest with interstitial crowding. There is no edema, consolidation, effusion, or pneumothorax. Normal heart size and mediastinal contours. No abnormal gas distension. No concerning mass effect or calcification. IMPRESSION: Unremarkable portable study. No visible pneumoperitoneum or excessive gas retention. Electronically Signed   By: Monte Fantasia M.D.   On: 01/21/2021 10:54    Review of Systems  Constitutional: Negative for diaphoresis and fever.  HENT: Negative.   Eyes: Negative.   Respiratory: Negative for  cough, chest tightness and shortness of breath.   Cardiovascular: Negative for chest pain.  Gastrointestinal: Positive for abdominal pain and vomiting. Negative for nausea.       Mild epigastric discomfort  Endocrine: Negative.   Genitourinary: Negative.   Musculoskeletal: Negative.   Allergic/Immunologic: Negative.   Neurological: Negative.   Hematological: Negative.   Psychiatric/Behavioral: Negative.    Blood pressure 127/79, pulse 76, temperature 98.1 F (36.7 C), temperature source Oral, resp. rate 20, height 5' 2"  (1.575 m), weight 79.4 kg, SpO2 96 %. Physical Exam Constitutional:      Appearance: Normal appearance.  HENT:     Head: Normocephalic and atraumatic.     Mouth/Throat:     Mouth: Mucous membranes are moist.     Pharynx: Oropharynx is clear.  Eyes:     Extraocular Movements: Extraocular movements intact.     Conjunctiva/sclera: Conjunctivae normal.     Pupils: Pupils are equal, round, and reactive to light.  Neck:     Vascular: No carotid bruit.  Cardiovascular:     Rate and Rhythm: Normal rate and regular rhythm.  Heart sounds: Normal heart sounds. No murmur heard. No friction rub.  Pulmonary:     Effort: Pulmonary effort is normal.     Breath sounds: Normal breath sounds.  Abdominal:     General: Bowel sounds are normal. There is no distension.     Palpations: Abdomen is soft.     Tenderness: There is no abdominal tenderness.  Musculoskeletal:        General: No swelling.     Cervical back: Normal range of motion and neck supple. No tenderness.  Lymphadenopathy:     Cervical: No cervical adenopathy.  Skin:    General: Skin is warm and dry.  Neurological:     General: No focal deficit present.     Mental Status: She is alert and oriented to person, place, and time.  Psychiatric:        Mood and Affect: Mood normal.        Behavior: Behavior normal.    Narrative & Impression  CLINICAL DATA:  Esophageal perforation on CT abdomen/pelvis.  Pain after upper endoscopy and colonoscopy 1 day ago.  EXAM: CT CHEST WITHOUT CONTRAST  TECHNIQUE: Multidetector CT imaging of the chest was performed following the standard protocol without IV contrast.  COMPARISON:  Abdominopelvic CT earlier today.  FINDINGS: Cardiovascular: Normal heart size. Coronary artery calcifications. No pericardial effusion. Atherosclerosis of the thoracic aorta. No aortic aneurysm. Conventional branching pattern from the aortic arch.  Mediastinum/Nodes: Administered enteric contrast opacifies the mid and distal esophagus. There is no evidence of contrast extravasation. Wall thickening of the distal esophagus with small foci of pneumomediastinum at and just beyond the gastroesophageal junction, series 5, images 94 and 100. The degree of pneumomediastinum has improved from abdominal CT earlier today. Small amount of fluid right posterolateral esophagus as seen on CT. No contrast extends into the paraesophageal fluid. No bulky mediastinal or obvious hilar adenopathy. No thyroid nodule.  Lungs/Pleura: No pneumothorax. Dependent atelectasis within both lower lobes without confluent consolidation. Minimal right pleural thickening, no significant pleural fluid. Trachea and central bronchi are patent. No pulmonary edema. Calcified granuloma noted in the right middle lobe.  Upper Abdomen: Assessed and abdominopelvic CT earlier today. Enteric contrast in the stomach without evidence of gastric extravasation. Excreted IV contrast within both renal collecting systems.  Musculoskeletal: There are no acute or suspicious osseous abnormalities.  IMPRESSION: 1. No extravasation of enteric contrast from the esophagus. There is wall thickening of the distal esophagus with small volume of pneumomediastinum at and just beyond the gastroesophageal junction, raising concern for esophageal perforation. The degree of pneumomediastinum has improved from CT  earlier today. 2. Dependent atelectasis in both lower lobes. 3. Aortic atherosclerosis.  Coronary artery calcifications.  Aortic Atherosclerosis (ICD10-I70.0).   Electronically Signed   By: Keith Rake M.D.   On: 01/21/2021 16:21     Assessment/Plan:  This 65 year old woman had a colonoscopy and EGD on the morning of 01/21/2021 and has some abdominal discomfort after the procedure that was epigastric and worsened over the course of her recovery.  This progressed to severe abdominal pain that doubled her over while driving home with an episode of vomiting prompting a visit to [facility] emergency room.  CT of the abdomen pelvis with contrast showed some extraluminal air and fluid around the distal esophagus suggesting distal esophageal perforation.  CT of the chest with contrast showed a small amount of air outside the esophagus with minimal fluid and no extravasation of contrast.  She was afebrile with a normal  white blood cell count with minimal epigastric discomfort when I saw her last night.  This all points to a small distal esophageal perforation.  This may be related to the biopsies of her distal esophagus.  If the perforation was due to the dilatation I would expect a larger hole and extravasation.  Since there is no extravasation or signs of sepsis I think it is reasonable to treat her conservatively with IV antibiotics and keep her n.p.o. with continued observation.  Hopefully this can be managed without surgical treatment and if she does well over the weekend I would plan to do a Gastrografin swallow on Monday for reevaluation.  I discussed all this with her and she is in agreement.  Gaye Pollack 01/22/2021, 7:35 AM

## 2021-01-22 NOTE — Plan of Care (Signed)
  Problem: Education: Goal: Knowledge of General Education information will improve Description: Including pain rating scale, medication(s)/side effects and non-pharmacologic comfort measures Outcome: Progressing   Problem: Health Behavior/Discharge Planning: Goal: Ability to manage health-related needs will improve Outcome: Progressing   Problem: Clinical Measurements: Goal: Diagnostic test results will improve Outcome: Progressing   Problem: Activity: Goal: Risk for activity intolerance will decrease Outcome: Progressing   Problem: Pain Managment: Goal: General experience of comfort will improve Outcome: Progressing   Problem: Skin Integrity: Goal: Risk for impaired skin integrity will decrease Outcome: Progressing

## 2021-01-22 NOTE — Progress Notes (Signed)
PROGRESS NOTE    Cathy Goodwin  SFK:[id number] DOB: Mar 20, 1956 DOA: 01/21/2021 PCP: Loraine Leriche., MD   Chief Complain: Abdominal pain  Brief Narrative: Patient is a 65 year old female with history of hypertension who had an EGD/colonoscopy procedure done as an outpatient at [facility] on 01/21/2021, seen in the emergency department on the way back to home because she developed severe abdominal pain while driving in the car and passed out.  She was suspected to have esophageal perforation on imagings done at the emergency department and she was sent to [facility].  CT abdomen/pelvis had shown fluid and a small amount of extraluminal air within the posterior mediastinum adjacent to the distal esophagus, indicative of an esophageal perforation.CT chest showed  wall thickening of the distal esophagus with small volume of pneumomediastinum at and just beyond the gastroesophageal junction, raising concern for esophageal perforation.  She was admitted, cardiothoracic surgery consulted.  Plan for conservative management.  She has been started on IV fluids, antibiotics.  She does not complain of any chest or abdominal pain today.  Plan for barium swallow on Monday.  Currently n.p.o.  Assessment & Plan:   Principal Problem:   Esophageal perforation Active Problems:   Essential (primary) hypertension   Suspected esophageal perforation:EGD/colonoscopy procedure done as an outpatient at [facility] on 01/21/2021, seen in the emergency department on the way back to home because she developed severe abdominal pain while driving in the car and passed out.  She was suspected to have esophageal perforation on imagings done at the emergency department and she was sent to [facility]  CT abdomen/pelvis had shown fluid and a small amount of extraluminal air within the posterior mediastinum adjacent to the distal esophagus, indicative of an esophageal perforation.CT chest showed  wall thickening of the  distal esophagus with small volume of pneumomediastinum at and just beyond the gastroesophageal junction, raising concern for esophageal perforation.  Cardiothoracic surgery following.  Plan for conservative management.  She has been started on IV fluids, antibiotics.  She does not complain of any chest or abdominal pain today but says she feels something in her epigastrium . Plan for barium swallow on Monday.  Currently n.p.o. Minimize narcotics, continue Protonix, IV antiemetics, gentle IV fluids.  Hypertension: Monitor blood pressure.  Currently stable.  Use as needed IV meds for severe hypertension  Leukocytosis.  Mild.  Continue to monitor.  Continue current antibiotics          DVT prophylaxis:SCD Code Status: Full Family Communication: None present at the bedside Status is: Inpatient  Remains inpatient appropriate because:Inpatient level of care appropriate due to severity of illness   Dispo: The patient is from: Home              Anticipated d/c is to: Home              Patient currently is not medically stable to d/c.   Difficult to place patient No     Consultants: Cardiothoracic surgery  Procedures:None  Antimicrobials:  Anti-infectives (From admission, onward)   Start     Dose/Rate Route Frequency Ordered Stop   01/22/21 0400  piperacillin-tazobactam (ZOSYN) IVPB 3.375 g        3.375 g 12.5 mL/hr over 240 Minutes Intravenous Every 8 hours 01/22/21 0226     01/21/21 1500  vancomycin (VANCOREADY) IVPB 1500 mg/300 mL        1,500 mg 150 mL/hr over 120 Minutes Intravenous  Once 01/21/21 1409 01/21/21 1723  01/21/21 1415  piperacillin-tazobactam (ZOSYN) IVPB 3.375 g        3.375 g 100 mL/hr over 30 Minutes Intravenous STAT 01/21/21 1409 01/21/21 1451      Subjective: Patient seen and examined the bedside this morning.  Hemodynamically stable.  Overall looks very comfortable.  Denies any acute issues but says she feels something under her lower chest or  epigastrium but says it is s not that significant.  Objective: Vitals:   01/21/21 1500 01/21/21 1900 01/21/21 2118 01/22/21 0839  BP: 124/85 127/81 127/79 117/67  Pulse: 86 76 76 65  Resp: 20 19 20 17   Temp:   98.1 F (36.7 C) 98.3 F (36.8 C)  TempSrc:   Oral Oral  SpO2: 93% 96%  94%  Weight:      Height:       No intake or output data in the 24 hours ending 01/22/21 1151 Filed Weights   01/21/21 0943  Weight: 79.4 kg    Examination:  General exam: Appears calm and comfortable ,Not in distress,average built, pleasant female HEENT:PERRL,Oral mucosa moist, Ear/Nose normal on gross exam Respiratory system: Bilateral equal air entry, normal vesicular breath sounds, no wheezes or crackles  Cardiovascular system: S1 & S2 heard, RRR. No JVD, murmurs, rubs, gallops or clicks. No pedal edema. Gastrointestinal system: Abdomen is nondistended, soft and nontender. No organomegaly or masses felt. Normal bowel sounds heard. Central nervous system: Alert and oriented. No focal neurological deficits. Extremities: No edema, no clubbing ,no cyanosis, distal peripheral pulses palpable. Skin: No rashes, lesions or ulcers,no icterus ,no pallor   Data Reviewed: I have personally reviewed following labs and imaging studies  CBC: Recent Labs  Lab 01/21/21 0952 01/22/21 0221  WBC 4.8 10.8*  NEUTROABS 3.4  --   HGB 13.9 12.7  HCT 42.1 38.6  MCV 94.8 95.5  PLT 318 417   Basic Metabolic Panel: Recent Labs  Lab 01/21/21 0952 01/22/21 0221  NA 140 139  K 4.1 4.7  CL 108 106  CO2 23 25  GLUCOSE 164* 130*  BUN 12 11  CREATININE 0.99 0.96  CALCIUM 9.4 9.1   GFR: Estimated Creatinine Clearance: 58.5 mL/min (by C-G formula based on SCr of 0.96 mg/dL). Liver Function Tests: Recent Labs  Lab 01/21/21 0952  AST 23  ALT 27  ALKPHOS 84  BILITOT 1.2  PROT 7.8  ALBUMIN 4.3   Recent Labs  Lab 01/21/21 0952  LIPASE 34   No results for input(s): AMMONIA in the last 168  hours. Coagulation Profile: No results for input(s): INR, PROTIME in the last 168 hours. Cardiac Enzymes: No results for input(s): CKTOTAL, CKMB, CKMBINDEX, TROPONINI in the last 168 hours. BNP (last 3 results) No results for input(s): PROBNP in the last 8760 hours. HbA1C: No results for input(s): HGBA1C in the last 72 hours. CBG: No results for input(s): GLUCAP in the last 168 hours. Lipid Profile: No results for input(s): CHOL, HDL, LDLCALC, TRIG, CHOLHDL, LDLDIRECT in the last 72 hours. Thyroid Function Tests: No results for input(s): TSH, T4TOTAL, FREET4, T3FREE, THYROIDAB in the last 72 hours. Anemia Panel: No results for input(s): VITAMINB12, FOLATE, FERRITIN, TIBC, IRON, RETICCTPCT in the last 72 hours. Sepsis Labs: No results for input(s): PROCALCITON, LATICACIDVEN in the last 168 hours.  Recent Results (from the past 240 hour(s))  Resp Panel by RT-PCR (Flu A&B, Covid) Nasopharyngeal Swab     Status: None   Collection Time: 01/21/21  8:00 PM   Specimen: Nasopharyngeal Swab; Nasopharyngeal(NP) swabs in  vial transport medium  Result Value Ref Range Status   SARS Coronavirus 2 by RT PCR NEGATIVE NEGATIVE Final    Comment: (NOTE) SARS-CoV-2 target nucleic acids are NOT DETECTED.  The SARS-CoV-2 RNA is generally detectable in upper respiratory specimens during the acute phase of infection. The lowest concentration of SARS-CoV-2 viral copies this assay can detect is 138 copies/mL. A negative result does not preclude SARS-Cov-2 infection and should not be used as the sole basis for treatment or other patient management decisions. A negative result may occur with  improper specimen collection/handling, submission of specimen other than nasopharyngeal swab, presence of viral mutation(s) within the areas targeted by this assay, and inadequate number of viral copies(<138 copies/mL). A negative result must be combined with clinical observations, patient history, and  epidemiological information. The expected result is Negative.  Fact Sheet for Patients:  EntrepreneurPulse.com.au  Fact Sheet for Healthcare Providers:  IncredibleEmployment.be  This test is no t yet approved or cleared by the [place] FDA and  has been authorized for detection and/or diagnosis of SARS-CoV-2 by FDA under an Emergency Use Authorization (EUA). This EUA will remain  in effect (meaning this test can be used) for the duration of the COVID-19 declaration under Section 564(b)(1) of the Act, 21 U.S.C.section 360bbb-3(b)(1), unless the authorization is terminated  or revoked sooner.       Influenza A by PCR NEGATIVE NEGATIVE Final   Influenza B by PCR NEGATIVE NEGATIVE Final    Comment: (NOTE) The Xpert Xpress SARS-CoV-2/FLU/RSV plus assay is intended as an aid in the diagnosis of influenza from Nasopharyngeal swab specimens and should not be used as a sole basis for treatment. Nasal washings and aspirates are unacceptable for Xpert Xpress SARS-CoV-2/FLU/RSV testing.  Fact Sheet for Patients: EntrepreneurPulse.com.au  Fact Sheet for Healthcare Providers: IncredibleEmployment.be  This test is not yet approved or cleared by the [place] FDA and has been authorized for detection and/or diagnosis of SARS-CoV-2 by FDA under an Emergency Use Authorization (EUA). This EUA will remain in effect (meaning this test can be used) for the duration of the COVID-19 declaration under Section 564(b)(1) of the Act, 21 U.S.C. section 360bbb-3(b)(1), unless the authorization is terminated or revoked.  Performed at [facility], [place] [address] [place], [place] [address]          Radiology Studies: CT Chest Wo Contrast  Result Date: 01/21/2021 CLINICAL DATA:  Esophageal perforation on CT abdomen/pelvis. Pain after upper endoscopy and colonoscopy 1 day ago. EXAM: CT CHEST WITHOUT  CONTRAST TECHNIQUE: Multidetector CT imaging of the chest was performed following the standard protocol without IV contrast. COMPARISON:  Abdominopelvic CT earlier today. FINDINGS: Cardiovascular: Normal heart size. Coronary artery calcifications. No pericardial effusion. Atherosclerosis of the thoracic aorta. No aortic aneurysm. Conventional branching pattern from the aortic arch. Mediastinum/Nodes: Administered enteric contrast opacifies the mid and distal esophagus. There is no evidence of contrast extravasation. Wall thickening of the distal esophagus with small foci of pneumomediastinum at and just beyond the gastroesophageal junction, series 5, images 94 and 100. The degree of pneumomediastinum has improved from abdominal CT earlier today. Small amount of fluid right posterolateral esophagus as seen on CT. No contrast extends into the paraesophageal fluid. No bulky mediastinal or obvious hilar adenopathy. No thyroid nodule. Lungs/Pleura: No pneumothorax. Dependent atelectasis within both lower lobes without confluent consolidation. Minimal right pleural thickening, no significant pleural fluid. Trachea and central bronchi are patent. No pulmonary edema. Calcified granuloma noted in the right middle lobe.  Upper Abdomen: Assessed and abdominopelvic CT earlier today. Enteric contrast in the stomach without evidence of gastric extravasation. Excreted IV contrast within both renal collecting systems. Musculoskeletal: There are no acute or suspicious osseous abnormalities. IMPRESSION: 1. No extravasation of enteric contrast from the esophagus. There is wall thickening of the distal esophagus with small volume of pneumomediastinum at and just beyond the gastroesophageal junction, raising concern for esophageal perforation. The degree of pneumomediastinum has improved from CT earlier today. 2. Dependent atelectasis in both lower lobes. 3. Aortic atherosclerosis.  Coronary artery calcifications. Aortic Atherosclerosis  (ICD10-I70.0). Electronically Signed   By: Keith Rake M.D.   On: 01/21/2021 16:21   CT ABDOMEN PELVIS W CONTRAST  Result Date: 01/21/2021 CLINICAL DATA:  Abdominal pain following upper endoscopy and colonoscopy 1 day ago EXAM: CT ABDOMEN AND PELVIS WITH CONTRAST TECHNIQUE: Multidetector CT imaging of the abdomen and pelvis was performed using the standard protocol following bolus administration of intravenous contrast. CONTRAST:  151m OMNIPAQUE IOHEXOL 300 MG/ML  SOLN COMPARISON:  None. FINDINGS: Lower chest: Fluid present within the posterior mediastinum adjacent to the distal esophagus where there is also a small amount of extraluminal air (series 2, images 12-15). Mild streaky dependent bibasilar opacities, likely atelectasis. Heart size within normal limits. Hepatobiliary: Mildly decreased attenuation of the hepatic parenchyma suggesting hepatic steatosis. No focal hepatic lesion identified. Unremarkable gallbladder. No hyperdense gallstone. No biliary dilatation. Pancreas: Unremarkable. No pancreatic ductal dilatation or surrounding inflammatory changes. Spleen: Normal in size without focal abnormality. Adrenals/Urinary Tract: Unremarkable adrenal glands. Kidneys enhance symmetrically without focal lesion, stone, or hydronephrosis. Ureters are nondilated. Urinary bladder is decompressed, limiting its evaluation. Stomach/Bowel: Stomach is unremarkable. No dilated loops of small bowel to suggest obstruction. Diverticulosis predominantly involving the sigmoid colon. Submucosal fat deposition is present within the cecum and ascending colon. Mild long segment thickening within the sigmoid colon, which may be reactive secondary to recent colonoscopy. No pericolonic inflammatory changes. Normal appendix is present in the right lower quadrant (series 5, image 92) Vascular/Lymphatic: No significant vascular findings are present. No enlarged abdominal or pelvic lymph nodes. Reproductive: Uterus and bilateral  adnexa are unremarkable. Other: No free fluid. No abdominopelvic fluid collection. Single focus of intraperitoneal air adjacent to the esophageal hiatus (series 4, image 43. No gross pneumoperitoneum. No abdominal wall hernia. Musculoskeletal: No acute or significant osseous findings. IMPRESSION: 1. Fluid and a small amount of extraluminal air within the posterior mediastinum adjacent to the distal esophagus. Findings indicative of an esophageal perforation. 2. No gross free intraperitoneal air. 3. Mild long segment thickening within the sigmoid colon, which may be reactive secondary to recent colonoscopy. No evidence of large bowel perforation. 4. Submucosal fat deposition within the cecum and ascending colon, which can be seen with chronic inflammatory bowel disease. 5. Hepatic steatosis. These results were called by telephone at the time of interpretation on 01/21/2021 at 1:22 pm to provider CLAUDIA GIBBONS , who verbally acknowledged these results. Electronically Signed   By: NDavina PokeD.O.   On: 01/21/2021 13:24   DG CHEST PORT 1 VIEW  Result Date: 01/22/2021 CLINICAL DATA:  Esophageal perforation. EXAM: PORTABLE CHEST 1 VIEW COMPARISON:  01/21/2021 FINDINGS: Mild cardiomegaly remains stable. No evidence of pneumomediastinum or pneumothorax. No evidence of pulmonary infiltrate or pleural effusion. IMPRESSION: Stable mild cardiomegaly. No active lung disease. Electronically Signed   By: JMarlaine HindM.D.   On: 01/22/2021 08:26   DG Abd Acute W/Chest  Result Date: 01/21/2021 CLINICAL DATA:  Epigastric pain after EGD  and colonoscopy EXAM: DG ABDOMEN ACUTE WITH 1 VIEW CHEST COMPARISON:  None. FINDINGS: Under penetrated study. No visible pneumoperitoneum or pneumatosis. Low volume chest with interstitial crowding. There is no edema, consolidation, effusion, or pneumothorax. Normal heart size and mediastinal contours. No abnormal gas distension. No concerning mass effect or calcification. IMPRESSION:  Unremarkable portable study. No visible pneumoperitoneum or excessive gas retention. Electronically Signed   By: Monte Fantasia M.D.   On: 01/21/2021 10:54        Scheduled Meds: . pantoprazole (PROTONIX) IV  40 mg Intravenous Q12H   Continuous Infusions: . dextrose 75 mL/hr at 01/22/21 1013  . piperacillin-tazobactam (ZOSYN)  IV 3.375 g (01/22/21 0329)     LOS: 1 day    Time spent: 35 mins,More than 50% of that time was spent in counseling and/or coordination of care.      Shelly Coss, MD [facility] P4/23/2022, 11:51 AM

## 2021-01-23 DIAGNOSIS — K223 Perforation of esophagus: Secondary | ICD-10-CM | POA: Diagnosis not present

## 2021-01-23 LAB — CBC WITH DIFFERENTIAL/PLATELET
Abs Immature Granulocytes: 0.02 10*3/uL (ref 0.00–0.07)
Basophils Absolute: 0 10*3/uL (ref 0.0–0.1)
Basophils Relative: 0 %
Eosinophils Absolute: 0 10*3/uL (ref 0.0–0.5)
Eosinophils Relative: 0 %
HCT: 36.6 % (ref 36.0–46.0)
Hemoglobin: 11.8 g/dL — ABNORMAL LOW (ref 12.0–15.0)
Immature Granulocytes: 0 %
Lymphocytes Relative: 22 %
Lymphs Abs: 1.6 10*3/uL (ref 0.7–4.0)
MCH: 31.2 pg (ref 26.0–34.0)
MCHC: 32.2 g/dL (ref 30.0–36.0)
MCV: 96.8 fL (ref 80.0–100.0)
Monocytes Absolute: 0.6 10*3/uL (ref 0.1–1.0)
Monocytes Relative: 8 %
Neutro Abs: 5 10*3/uL (ref 1.7–7.7)
Neutrophils Relative %: 70 %
Platelets: 247 10*3/uL (ref 150–400)
RBC: 3.78 MIL/uL — ABNORMAL LOW (ref 3.87–5.11)
RDW: 12.8 % (ref 11.5–15.5)
WBC: 7.2 10*3/uL (ref 4.0–10.5)
nRBC: 0 % (ref 0.0–0.2)

## 2021-01-23 LAB — BASIC METABOLIC PANEL
Anion gap: 6 (ref 5–15)
BUN: 8 mg/dL (ref 8–23)
CO2: 25 mmol/L (ref 22–32)
Calcium: 8.7 mg/dL — ABNORMAL LOW (ref 8.9–10.3)
Chloride: 105 mmol/L (ref 98–111)
Creatinine, Ser: 0.92 mg/dL (ref 0.44–1.00)
GFR, Estimated: >60 mL/min (ref >60)
Glucose, Bld: 132 mg/dL — ABNORMAL HIGH (ref 70–99)
Potassium: 3.6 mmol/L (ref 3.5–5.1)
Sodium: 136 mmol/L (ref 135–145)

## 2021-01-23 MED ORDER — ACETAMINOPHEN 10 MG/ML IV SOLN: 1000.0000 mg | Freq: Four times a day (QID) | INTRAVENOUS | Status: AC | PRN

## 2021-01-23 NOTE — Plan of Care (Signed)
  Problem: Education: Goal: Knowledge of General Education information will improve Description: Including pain rating scale, medication(s)/side effects and non-pharmacologic comfort measures Outcome: Progressing   Problem: Health Behavior/Discharge Planning: Goal: Ability to manage health-related needs will improve Outcome: Progressing   Problem: Activity: Goal: Risk for activity intolerance will decrease Outcome: Progressing   Problem: Nutrition: Goal: Adequate nutrition will be maintained Outcome: Progressing   Problem: Elimination: Goal: Will not experience complications related to bowel motility Outcome: Progressing   Problem: Pain Managment: Goal: General experience of comfort will improve Outcome: Progressing

## 2021-01-23 NOTE — Progress Notes (Signed)
TCTS Subjective:  No further nausea or vomiting since off dilaudid. No abdominal pain. Having some loose stools.  Had headache last night and says they tried to give her tylenol tablets po which she smartly refused.  Objective: Vital signs in last 24 hours: Temp:  [98.8 F (37.1 C)-99.5 F (37.5 C)] 99.5 F (37.5 C) (04/24 0602) Pulse Rate:  [64-65] 65 (04/24 0602) Resp:  [16-17] 17 (04/24 0602) BP: (114-126)/(60-72) 115/72 (04/24 0602) SpO2:  [94 %-96 %] 96 % (04/24 0602)  Hemodynamic parameters for last 24 hours:    Intake/Output from previous day: 04/23 0701 - 04/24 0700 In: 550.1 [I.V.:472.9; IV Piggyback:77.1] Out: -  Intake/Output this shift: No intake/output data recorded.  General appearance: alert and cooperative, walking around Heart: regular rate and rhythm Lungs: clear to auscultation bilaterally Abdomen: soft, non-tender; bowel sounds normal  Lab Results: Recent Labs    01/22/21 0221 01/23/21 0254  WBC 10.8* 7.2  HGB 12.7 11.8*  HCT 38.6 36.6  PLT 284 247   BMET:  Recent Labs    01/22/21 0221 01/23/21 0254  NA 139 136  K 4.7 3.6  CL 106 105  CO2 25 25  GLUCOSE 130* 132*  BUN 11 8  CREATININE 0.96 0.92  CALCIUM 9.1 8.7*    PT/INR: No results for input(s): LABPROT, INR in the last 72 hours. ABG No results found for: PHART, HCO3, TCO2, ACIDBASEDEF, O2SAT CBG (last 3)  No results for input(s): GLUCAP in the last 72 hours.  Assessment/Plan:  Distal esophageal perforation after EGD with periesophageal air but no extravasation on CT with oral contrast. She remains clinically well with no fever and WBC back to normal at 7.2. Continue antibiotics, strict NPO. I ordered IV tylenol for pain prn. Will order GG esophagram followed by thin barium if no leak tomorrow. If no leak then she can start on clear liquids.   LOS: 2 days    Gaye Pollack 01/23/2021

## 2021-01-23 NOTE — Progress Notes (Signed)
PROGRESS NOTE    Cathy Goodwin  QTM:[id number] DOB: [birth year] DOA: 01/21/2021 PCP: Loraine Leriche., MD   Chief Complain: Abdominal pain  Brief Narrative: Patient is a 65 year old female with history of hypertension who had an EGD/colonoscopy procedure done as an outpatient at [facility] on 01/21/2021, seen in the emergency department on the way back to home because she developed severe abdominal pain while driving in the car and passed out.  She was suspected to have esophageal perforation on imagings done at the emergency department and she was sent to [facility].  CT abdomen/pelvis had shown fluid and a small amount of extraluminal air within the posterior mediastinum adjacent to the distal esophagus, indicative of an esophageal perforation.CT chest showed  wall thickening of the distal esophagus with small volume of pneumomediastinum at and just beyond the gastroesophageal junction, raising concern for esophageal perforation.  She was admitted, cardiothoracic surgery consulted.  Plan for conservative management.  She has been started on IV fluids, antibiotics.  She does not complain of any chest or abdominal pain today.  Plan for esophragm on Monday.  Currently n.p.o.  Assessment & Plan:   Principal Problem:   Esophageal perforation Active Problems:   Essential (primary) hypertension   Suspected esophageal perforation:EGD/colonoscopy procedure done as an outpatient at [facility] on 01/21/2021, seen in the emergency department on the way back to home because she developed severe abdominal pain while driving in the car and passed out.  She was suspected to have esophageal perforation on imagings done at the emergency department and she was sent to [facility].  CT abdomen/pelvis had shown fluid and a small amount of extraluminal air within the posterior mediastinum adjacent to the distal esophagus, indicative of an esophageal perforation.CT chest showed  wall thickening of the  distal esophagus with small volume of pneumomediastinum at and just beyond the gastroesophageal junction, raising concern for esophageal perforation.  Cardiothoracic surgery following.  Plan for conservative management.  She has been started on IV fluids, antibiotics.  She does not complain of any chest or abdominal pain today but says she feels something in her epigastrium . Plan for Gastrografin esophagram on Monday.  Currently n.p.o. Minimize narcotics, continue Protonix, IV antiemetics, gentle IV fluids.  Hypertension: Monitor blood pressure.  Currently stable.  Use as needed IV meds for severe hypertension  Leukocytosis.  Resolved. Continue to monitor.  Continue current antibiotics          DVT prophylaxis:SCD Code Status: Full Family Communication: None present at the bedside Status is: Inpatient  Remains inpatient appropriate because:Inpatient level of care appropriate due to severity of illness   Dispo: The patient is from: Home              Anticipated d/c is to: Home              Patient currently is not medically stable to d/c.   Difficult to place patient No     Consultants: Cardiothoracic surgery  Procedures:None  Antimicrobials:  Anti-infectives (From admission, onward)   Start     Dose/Rate Route Frequency Ordered Stop   01/22/21 0400  piperacillin-tazobactam (ZOSYN) IVPB 3.375 g        3.375 g 12.5 mL/hr over 240 Minutes Intravenous Every 8 hours 01/22/21 0226     01/21/21 1500  vancomycin (VANCOREADY) IVPB 1500 mg/300 mL        1,500 mg 150 mL/hr over 120 Minutes Intravenous  Once 01/21/21 1409 01/21/21 1723   01/21/21 1415  piperacillin-tazobactam (ZOSYN) IVPB 3.375 g        3.375 g 100 mL/hr over 30 Minutes Intravenous STAT 01/21/21 1409 01/21/21 1451      Subjective:  Patient seen and examined the bedside this morning. Hemodynamically stable.Complains of upper epigastric pain,but overall comfortable  Objective: Vitals:   01/22/21 1403  01/22/21 2055 01/23/21 0104 01/23/21 0602  BP: 114/72 126/71 122/60 115/72  Pulse: 64 65 64 65  Resp: 17 16 16 17   Temp: 98.8 F (37.1 C) 99.4 F (37.4 C) 99.1 F (37.3 C) 99.5 F (37.5 C)  TempSrc: Oral Oral Oral Oral  SpO2: 95% 94% 95% 96%  Weight:      Height:        Intake/Output Summary (Last 24 hours) at 01/23/2021 0819 Last data filed at 01/22/2021 1633 Gross per 24 hour  Intake 550.05 ml  Output --  Net 550.05 ml   Filed Weights   01/21/21 0943  Weight: 79.4 kg    Examination:  General exam: Overall comfortable, not in distress HEENT: PERRL Respiratory system:  no wheezes or crackles  Cardiovascular system: S1 & S2 heard, RRR.  Gastrointestinal system: Abdomen is nondistended, soft and nontender. Central nervous system: Alert and oriented Extremities: No edema, no clubbing ,no cyanosis Skin: No rashes, no ulcers,no icterus     Data Reviewed: I have personally reviewed following labs and imaging studies  CBC: Recent Labs  Lab 01/21/21 0952 01/22/21 0221 01/23/21 0254  WBC 4.8 10.8* 7.2  NEUTROABS 3.4  --  5.0  HGB 13.9 12.7 11.8*  HCT 42.1 38.6 36.6  MCV 94.8 95.5 96.8  PLT 318 284 194   Basic Metabolic Panel: Recent Labs  Lab 01/21/21 0952 01/22/21 0221 01/23/21 0254  NA 140 139 136  K 4.1 4.7 3.6  CL 108 106 105  CO2 23 25 25   GLUCOSE 164* 130* 132*  BUN 12 11 8   CREATININE 0.99 0.96 0.92  CALCIUM 9.4 9.1 8.7*   GFR: Estimated Creatinine Clearance: 61.1 mL/min (by C-G formula based on SCr of 0.92 mg/dL). Liver Function Tests: Recent Labs  Lab 01/21/21 0952  AST 23  ALT 27  ALKPHOS 84  BILITOT 1.2  PROT 7.8  ALBUMIN 4.3   Recent Labs  Lab 01/21/21 0952  LIPASE 34   No results for input(s): AMMONIA in the last 168 hours. Coagulation Profile: No results for input(s): INR, PROTIME in the last 168 hours. Cardiac Enzymes: No results for input(s): CKTOTAL, CKMB, CKMBINDEX, TROPONINI in the last 168 hours. BNP (last 3  results) No results for input(s): PROBNP in the last 8760 hours. HbA1C: No results for input(s): HGBA1C in the last 72 hours. CBG: No results for input(s): GLUCAP in the last 168 hours. Lipid Profile: No results for input(s): CHOL, HDL, LDLCALC, TRIG, CHOLHDL, LDLDIRECT in the last 72 hours. Thyroid Function Tests: No results for input(s): TSH, T4TOTAL, FREET4, T3FREE, THYROIDAB in the last 72 hours. Anemia Panel: No results for input(s): VITAMINB12, FOLATE, FERRITIN, TIBC, IRON, RETICCTPCT in the last 72 hours. Sepsis Labs: No results for input(s): PROCALCITON, LATICACIDVEN in the last 168 hours.  Recent Results (from the past 240 hour(s))  Resp Panel by RT-PCR (Flu A&B, Covid) Nasopharyngeal Swab     Status: None   Collection Time: 01/21/21  8:00 PM   Specimen: Nasopharyngeal Swab; Nasopharyngeal(NP) swabs in vial transport medium  Result Value Ref Range Status   SARS Coronavirus 2 by RT PCR NEGATIVE NEGATIVE Final    Comment: (NOTE) SARS-CoV-2 target  nucleic acids are NOT DETECTED.  The SARS-CoV-2 RNA is generally detectable in upper respiratory specimens during the acute phase of infection. The lowest concentration of SARS-CoV-2 viral copies this assay can detect is 138 copies/mL. A negative result does not preclude SARS-Cov-2 infection and should not be used as the sole basis for treatment or other patient management decisions. A negative result may occur with  improper specimen collection/handling, submission of specimen other than nasopharyngeal swab, presence of viral mutation(s) within the areas targeted by this assay, and inadequate number of viral copies(<138 copies/mL). A negative result must be combined with clinical observations, patient history, and epidemiological information. The expected result is Negative.  Fact Sheet for Patients:  EntrepreneurPulse.com.au  Fact Sheet for Healthcare Providers:   IncredibleEmployment.be  This test is no t yet approved or cleared by the [place] FDA and  has been authorized for detection and/or diagnosis of SARS-CoV-2 by FDA under an Emergency Use Authorization (EUA). This EUA will remain  in effect (meaning this test can be used) for the duration of the COVID-19 declaration under Section 564(b)(1) of the Act, 21 U.S.C.section 360bbb-3(b)(1), unless the authorization is terminated  or revoked sooner.       Influenza A by PCR NEGATIVE NEGATIVE Final   Influenza B by PCR NEGATIVE NEGATIVE Final    Comment: (NOTE) The Xpert Xpress SARS-CoV-2/FLU/RSV plus assay is intended as an aid in the diagnosis of influenza from Nasopharyngeal swab specimens and should not be used as a sole basis for treatment. Nasal washings and aspirates are unacceptable for Xpert Xpress SARS-CoV-2/FLU/RSV testing.  Fact Sheet for Patients: EntrepreneurPulse.com.au  Fact Sheet for Healthcare Providers: IncredibleEmployment.be  This test is not yet approved or cleared by the [place] FDA and has been authorized for detection and/or diagnosis of SARS-CoV-2 by FDA under an Emergency Use Authorization (EUA). This EUA will remain in effect (meaning this test can be used) for the duration of the COVID-19 declaration under Section 564(b)(1) of the Act, 21 U.S.C. section 360bbb-3(b)(1), unless the authorization is terminated or revoked.  Performed at [facility], [place] [address], [place], [place] [address]          Radiology Studies: CT Chest Wo Contrast  Result Date: 01/21/2021 CLINICAL DATA:  Esophageal perforation on CT abdomen/pelvis. Pain after upper endoscopy and colonoscopy 1 day ago. EXAM: CT CHEST WITHOUT CONTRAST TECHNIQUE: Multidetector CT imaging of the chest was performed following the standard protocol without IV contrast. COMPARISON:  Abdominopelvic CT earlier today.  FINDINGS: Cardiovascular: Normal heart size. Coronary artery calcifications. No pericardial effusion. Atherosclerosis of the thoracic aorta. No aortic aneurysm. Conventional branching pattern from the aortic arch. Mediastinum/Nodes: Administered enteric contrast opacifies the mid and distal esophagus. There is no evidence of contrast extravasation. Wall thickening of the distal esophagus with small foci of pneumomediastinum at and just beyond the gastroesophageal junction, series 5, images 94 and 100. The degree of pneumomediastinum has improved from abdominal CT earlier today. Small amount of fluid right posterolateral esophagus as seen on CT. No contrast extends into the paraesophageal fluid. No bulky mediastinal or obvious hilar adenopathy. No thyroid nodule. Lungs/Pleura: No pneumothorax. Dependent atelectasis within both lower lobes without confluent consolidation. Minimal right pleural thickening, no significant pleural fluid. Trachea and central bronchi are patent. No pulmonary edema. Calcified granuloma noted in the right middle lobe. Upper Abdomen: Assessed and abdominopelvic CT earlier today. Enteric contrast in the stomach without evidence of gastric extravasation. Excreted IV contrast within both renal collecting systems. Musculoskeletal:  There are no acute or suspicious osseous abnormalities. IMPRESSION: 1. No extravasation of enteric contrast from the esophagus. There is wall thickening of the distal esophagus with small volume of pneumomediastinum at and just beyond the gastroesophageal junction, raising concern for esophageal perforation. The degree of pneumomediastinum has improved from CT earlier today. 2. Dependent atelectasis in both lower lobes. 3. Aortic atherosclerosis.  Coronary artery calcifications. Aortic Atherosclerosis (ICD10-I70.0). Electronically Signed   By: Keith Rake M.D.   On: 01/21/2021 16:21   CT ABDOMEN PELVIS W CONTRAST  Result Date: 01/21/2021 CLINICAL DATA:   Abdominal pain following upper endoscopy and colonoscopy 1 day ago EXAM: CT ABDOMEN AND PELVIS WITH CONTRAST TECHNIQUE: Multidetector CT imaging of the abdomen and pelvis was performed using the standard protocol following bolus administration of intravenous contrast. CONTRAST:  162m OMNIPAQUE IOHEXOL 300 MG/ML  SOLN COMPARISON:  None. FINDINGS: Lower chest: Fluid present within the posterior mediastinum adjacent to the distal esophagus where there is also a small amount of extraluminal air (series 2, images 12-15). Mild streaky dependent bibasilar opacities, likely atelectasis. Heart size within normal limits. Hepatobiliary: Mildly decreased attenuation of the hepatic parenchyma suggesting hepatic steatosis. No focal hepatic lesion identified. Unremarkable gallbladder. No hyperdense gallstone. No biliary dilatation. Pancreas: Unremarkable. No pancreatic ductal dilatation or surrounding inflammatory changes. Spleen: Normal in size without focal abnormality. Adrenals/Urinary Tract: Unremarkable adrenal glands. Kidneys enhance symmetrically without focal lesion, stone, or hydronephrosis. Ureters are nondilated. Urinary bladder is decompressed, limiting its evaluation. Stomach/Bowel: Stomach is unremarkable. No dilated loops of small bowel to suggest obstruction. Diverticulosis predominantly involving the sigmoid colon. Submucosal fat deposition is present within the cecum and ascending colon. Mild long segment thickening within the sigmoid colon, which may be reactive secondary to recent colonoscopy. No pericolonic inflammatory changes. Normal appendix is present in the right lower quadrant (series 5, image 92) Vascular/Lymphatic: No significant vascular findings are present. No enlarged abdominal or pelvic lymph nodes. Reproductive: Uterus and bilateral adnexa are unremarkable. Other: No free fluid. No abdominopelvic fluid collection. Single focus of intraperitoneal air adjacent to the esophageal hiatus (series 4,  image 43. No gross pneumoperitoneum. No abdominal wall hernia. Musculoskeletal: No acute or significant osseous findings. IMPRESSION: 1. Fluid and a small amount of extraluminal air within the posterior mediastinum adjacent to the distal esophagus. Findings indicative of an esophageal perforation. 2. No gross free intraperitoneal air. 3. Mild long segment thickening within the sigmoid colon, which may be reactive secondary to recent colonoscopy. No evidence of large bowel perforation. 4. Submucosal fat deposition within the cecum and ascending colon, which can be seen with chronic inflammatory bowel disease. 5. Hepatic steatosis. These results were called by telephone at the time of interpretation on 01/21/2021 at 1:22 pm to provider CLAUDIA GIBBONS , who verbally acknowledged these results. Electronically Signed   By: NDavina PokeD.O.   On: 01/21/2021 13:24   DG CHEST PORT 1 VIEW  Result Date: 01/22/2021 CLINICAL DATA:  Esophageal perforation. EXAM: PORTABLE CHEST 1 VIEW COMPARISON:  01/21/2021 FINDINGS: Mild cardiomegaly remains stable. No evidence of pneumomediastinum or pneumothorax. No evidence of pulmonary infiltrate or pleural effusion. IMPRESSION: Stable mild cardiomegaly. No active lung disease. Electronically Signed   By: JMarlaine HindM.D.   On: 01/22/2021 08:26   DG Abd Acute W/Chest  Result Date: 01/21/2021 CLINICAL DATA:  Epigastric pain after EGD and colonoscopy EXAM: DG ABDOMEN ACUTE WITH 1 VIEW CHEST COMPARISON:  None. FINDINGS: Under penetrated study. No visible pneumoperitoneum or pneumatosis. Low volume chest with interstitial  crowding. There is no edema, consolidation, effusion, or pneumothorax. Normal heart size and mediastinal contours. No abnormal gas distension. No concerning mass effect or calcification. IMPRESSION: Unremarkable portable study. No visible pneumoperitoneum or excessive gas retention. Electronically Signed   By: Monte Fantasia M.D.   On: 01/21/2021 10:54         Scheduled Meds: . pantoprazole (PROTONIX) IV  40 mg Intravenous Q12H   Continuous Infusions: . dextrose 75 mL/hr at 01/23/21 0105  . piperacillin-tazobactam (ZOSYN)  IV 3.375 g (01/23/21 0605)     LOS: 2 days    Time spent: 35 mins,More than 50% of that time was spent in counseling and/or coordination of care.      Shelly Coss, MD [facility] P4/24/2022, 8:19 AM

## 2021-01-24 ENCOUNTER — Inpatient Hospital Stay (HOSPITAL_COMMUNITY): Payer: BC Managed Care – PPO

## 2021-01-24 DIAGNOSIS — K223 Perforation of esophagus: Secondary | ICD-10-CM | POA: Diagnosis not present

## 2021-01-24 LAB — CBC
HCT: 37.6 % (ref 36.0–46.0)
Hemoglobin: 12.6 g/dL (ref 12.0–15.0)
MCH: 31.4 pg (ref 26.0–34.0)
MCHC: 33.5 g/dL (ref 30.0–36.0)
MCV: 93.8 fL (ref 80.0–100.0)
Platelets: 257 10*3/uL (ref 150–400)
RBC: 4.01 MIL/uL (ref 3.87–5.11)
RDW: 12.4 % (ref 11.5–15.5)
WBC: 5.5 10*3/uL (ref 4.0–10.5)
nRBC: 0 % (ref 0.0–0.2)

## 2021-01-24 MED ORDER — LORAZEPAM 2 MG/ML IJ SOLN
1.0000 mg | Freq: Once | INTRAMUSCULAR | Status: AC
  Administered 2021-01-25: 1 mg via INTRAVENOUS
  Filled 2021-01-24: qty 1

## 2021-01-24 MED ORDER — IOHEXOL 300 MG/ML  SOLN
150.0000 mL | Freq: Once | INTRAMUSCULAR | Status: AC | PRN
  Administered 2021-01-24: 150 mL via ORAL

## 2021-01-24 MED ORDER — DEXTROSE-NACL 5-0.9 % IV SOLN: INTRAVENOUS | Status: DC

## 2021-01-24 MED ORDER — DIPHENHYDRAMINE HCL 50 MG/ML IJ SOLN
12.5000 mg | Freq: Once | INTRAMUSCULAR | Status: AC
  Administered 2021-01-24: 12.5 mg via INTRAVENOUS
  Filled 2021-01-24: qty 1

## 2021-01-24 NOTE — Progress Notes (Signed)
TCTS Subjective: Feels well. No abd pain. Some diarrhea persists, possible due to contrast.  Tolerated clear liquids today after esophagram showed no leak.  Objective: Vital signs in last 24 hours: Temp:  [98.3 F (36.8 C)-98.4 F (36.9 C)] 98.3 F (36.8 C) (04/25 1453) Pulse Rate:  [56-64] 58 (04/25 1453) Resp:  [14-17] 17 (04/25 1453) BP: (109-128)/(68-77) 127/77 (04/25 1453) SpO2:  [95 %-100 %] 100 % (04/25 1453)  Hemodynamic parameters for last 24 hours:    Intake/Output from previous day: No intake/output data recorded. Intake/Output this shift: No intake/output data recorded.  General appearance: alert and cooperative Abdomen: soft, non-tender; bowel sounds normal Lungs clear  Lab Results: Recent Labs    01/23/21 0254 01/24/21 0612  WBC 7.2 5.5  HGB 11.8* 12.6  HCT 36.6 37.6  PLT 247 257   BMET:  Recent Labs    01/22/21 0221 01/23/21 0254  NA 139 136  K 4.7 3.6  CL 106 105  CO2 25 25  GLUCOSE 130* 132*  BUN 11 8  CREATININE 0.96 0.92  CALCIUM 9.1 8.7*    PT/INR: No results for input(s): LABPROT, INR in the last 72 hours. ABG No results found for: PHART, HCO3, TCO2, ACIDBASEDEF, O2SAT CBG (last 3)  No results for input(s): GLUCAP in the last 72 hours.  Addendum    ADDENDUM REPORT: 01/24/2021 12:12  ADDENDUM: Mild irregularity of the distal esophagus with some mild narrowing suggested. This could be due to edema and postprocedural changes (as outlined in the original report) and/or residual stricture. Correlation with endoscopy results is suggested.  These results will be called to the ordering clinician or representative by the Radiologist Assistant, and communication documented in the PACS or [employer].   Electronically Signed   By: Zetta Bills M.D.   On: 01/24/2021 12:12   Addended by Felipa Emory, MD on 01/24/2021 12:15 PM   Study Result  Narrative & Impression  CLINICAL DATA:  A 65 year old female with  history of suspected esophageal perforation following endoscopy. Recent negative CT for leak.  EXAM: ESOPHOGRAM/BARIUM SWALLOW  TECHNIQUE: Single contrast examination was performed using first using water-soluble contrast followed subsequently with thin barium contrast material.  FLUOROSCOPY TIME:  Fluoroscopy Time:  2 minutes 54 seconds  Radiation Exposure Index (if provided by the fluoroscopic device): 37.4 mGy  Number of Acquired Spot Images: 0  COMPARISON:  CT of the chest from January 22, 2019 to  FINDINGS: Water-soluble contrast was administered followed by thin barium contrast material. Images were first performed in head of bed slightly elevated RAO and LPO positions showing no leak. AP positioning was also performed along with prone RAO as well positioning given the potential for leak along the anterior esophagus. None of these projections showed evidence contrast extravasation. Mild irregularity near the GE junction was noted potentially related to edema or recent procedure.  IMPRESSION: No sign of esophageal leak. Mild irregularity distally potentially due to edema and postprocedural changes.  Electronically Signed: By: Zetta Bills M.D. On: 01/24/2021 09:54     Assessment/Plan:  Distal esophageal perforation after EGD, probably very small with air and minimal fluid. Esophagram shows distal esophageal irregularity suggesting possible edema but no extravasation. Continue clear liquids tonight and if doing well tomorrow can take soft diet. Anticipate home Wednesday.  LOS: 3 days    Cathy Goodwin 01/24/2021

## 2021-01-24 NOTE — Progress Notes (Signed)
PROGRESS NOTE    Cathy Goodwin  PYP:[id number] DOB: 22-Jun-1956 DOA: 01/21/2021 PCP: Loraine Leriche., MD   Chief Complain: Abdominal pain  Brief Narrative: Patient is a 65 year old female with history of hypertension who had an EGD/colonoscopy procedure done as an outpatient at [facility] on 01/21/2021, seen in the emergency department on the way back to home because she developed severe abdominal pain while driving in the car and passed out.  She was suspected to have esophageal perforation on imagings done at the emergency department and she was sent to [facility].  CT abdomen/pelvis had shown fluid and a small amount of extraluminal air within the posterior mediastinum adjacent to the distal esophagus, indicative of an esophageal perforation.CT chest showed  wall thickening of the distal esophagus with small volume of pneumomediastinum at and just beyond the gastroesophageal junction, raising concern for esophageal perforation.  She was admitted, cardiothoracic surgery consulted.  Planned for conservative management.  She was started on IV fluids, antibiotics.  She does not complain of any chest or abdominal pain today.  Underwent esophagram today without finding of any leak, started on clear liquid diet.  Assessment & Plan:   Principal Problem:   Esophageal perforation Active Problems:   Essential (primary) hypertension   Suspected esophageal perforation:EGD/colonoscopy procedure done as an outpatient at [facility] on 01/21/2021, seen in the emergency department on the way back to home because she developed severe abdominal pain while driving in the car and passed out.  She was suspected to have esophageal perforation on imagings done at the emergency department and she was sent to [facility].  CT abdomen/pelvis had shown fluid and a small amount of extraluminal air within the posterior mediastinum adjacent to the distal esophagus, indicative of an esophageal perforation.CT chest  showed  wall thickening of the distal esophagus with small volume of pneumomediastinum at and just beyond the gastroesophageal junction, raising concern for esophageal perforation.  Cardiothoracic surgery following.  Planned for conservative management.  She has been started on IV fluids, antibiotics.  She does not complain of any chest or abdominal pain today . Underwent esophagram today without finding of any leak, started on clear liquid diet. Minimize narcotics, continue Protonix, IV antiemetics, gentle IV fluids for today.  We will advance her diet as per cardiothoracic surgery  Hypertension: Monitor blood pressure.  Currently stable.  Use as needed IV meds for severe hypertension  Leukocytosis.  Resolved. Continue to monitor.  Continue current antibiotics          DVT prophylaxis:SCD Code Status: Full Family Communication: None present at the bedside Status is: Inpatient  Remains inpatient appropriate because:Inpatient level of care appropriate due to severity of illness   Dispo: The patient is from: Home              Anticipated d/c is to: Home              Patient currently is not medically stable to d/c.   Difficult to place patient No  Needs cardiothoracic surgery clearance for discharge   Consultants: Cardiothoracic surgery  Procedures:None  Antimicrobials:  Anti-infectives (From admission, onward)   Start     Dose/Rate Route Frequency Ordered Stop   01/22/21 0400  piperacillin-tazobactam (ZOSYN) IVPB 3.375 g        3.375 g 12.5 mL/hr over 240 Minutes Intravenous Every 8 hours 01/22/21 0226     01/21/21 1500  vancomycin (VANCOREADY) IVPB 1500 mg/300 mL        1,500  mg 150 mL/hr over 120 Minutes Intravenous  Once 01/21/21 1409 01/21/21 1723   01/21/21 1415  piperacillin-tazobactam (ZOSYN) IVPB 3.375 g        3.375 g 100 mL/hr over 30 Minutes Intravenous STAT 01/21/21 1409 01/21/21 1451      Subjective:  Patient seen and examined the bedside this morning.   Hemodynamically stable.  Comfortable.  Does not complain of any abdominal pain or chest pain today  Objective: Vitals:   01/23/21 0602 01/23/21 2050 01/24/21 0000 01/24/21 0411  BP: 115/72 109/68 128/71 116/73  Pulse: 65 64 61 (!) 56  Resp: 17 14 15 17   Temp: 99.5 F (37.5 C) 98.3 F (36.8 C) 98.3 F (36.8 C) 98.4 F (36.9 C)  TempSrc: Oral Oral Oral Oral  SpO2: 96% 95% 96% 95%  Weight:      Height:       No intake or output data in the 24 hours ending 01/24/21 0804 Filed Weights   01/21/21 0943  Weight: 79.4 kg    Examination:  General exam: Overall comfortable, not in distress HEENT: PERRL Respiratory system:  no wheezes or crackles  Cardiovascular system: S1 & S2 heard, RRR.  Gastrointestinal system: Abdomen is nondistended, soft and nontender. Central nervous system: Alert and oriented Extremities: No edema, no clubbing ,no cyanosis Skin: No rashes, no ulcers,no icterus    Data Reviewed: I have personally reviewed following labs and imaging studies  CBC: Recent Labs  Lab 01/21/21 0952 01/22/21 0221 01/23/21 0254 01/24/21 0612  WBC 4.8 10.8* 7.2 5.5  NEUTROABS 3.4  --  5.0  --   HGB 13.9 12.7 11.8* 12.6  HCT 42.1 38.6 36.6 37.6  MCV 94.8 95.5 96.8 93.8  PLT 318 284 247 867   Basic Metabolic Panel: Recent Labs  Lab 01/21/21 0952 01/22/21 0221 01/23/21 0254  NA 140 139 136  K 4.1 4.7 3.6  CL 108 106 105  CO2 23 25 25   GLUCOSE 164* 130* 132*  BUN 12 11 8   CREATININE 0.99 0.96 0.92  CALCIUM 9.4 9.1 8.7*   GFR: Estimated Creatinine Clearance: 61.1 mL/min (by C-G formula based on SCr of 0.92 mg/dL). Liver Function Tests: Recent Labs  Lab 01/21/21 0952  AST 23  ALT 27  ALKPHOS 84  BILITOT 1.2  PROT 7.8  ALBUMIN 4.3   Recent Labs  Lab 01/21/21 0952  LIPASE 34   No results for input(s): AMMONIA in the last 168 hours. Coagulation Profile: No results for input(s): INR, PROTIME in the last 168 hours. Cardiac Enzymes: No results for  input(s): CKTOTAL, CKMB, CKMBINDEX, TROPONINI in the last 168 hours. BNP (last 3 results) No results for input(s): PROBNP in the last 8760 hours. HbA1C: No results for input(s): HGBA1C in the last 72 hours. CBG: No results for input(s): GLUCAP in the last 168 hours. Lipid Profile: No results for input(s): CHOL, HDL, LDLCALC, TRIG, CHOLHDL, LDLDIRECT in the last 72 hours. Thyroid Function Tests: No results for input(s): TSH, T4TOTAL, FREET4, T3FREE, THYROIDAB in the last 72 hours. Anemia Panel: No results for input(s): VITAMINB12, FOLATE, FERRITIN, TIBC, IRON, RETICCTPCT in the last 72 hours. Sepsis Labs: No results for input(s): PROCALCITON, LATICACIDVEN in the last 168 hours.  Recent Results (from the past 240 hour(s))  Resp Panel by RT-PCR (Flu A&B, Covid) Nasopharyngeal Swab     Status: None   Collection Time: 01/21/21  8:00 PM   Specimen: Nasopharyngeal Swab; Nasopharyngeal(NP) swabs in vial transport medium  Result Value Ref Range Status  SARS Coronavirus 2 by RT PCR NEGATIVE NEGATIVE Final    Comment: (NOTE) SARS-CoV-2 target nucleic acids are NOT DETECTED.  The SARS-CoV-2 RNA is generally detectable in upper respiratory specimens during the acute phase of infection. The lowest concentration of SARS-CoV-2 viral copies this assay can detect is 138 copies/mL. A negative result does not preclude SARS-Cov-2 infection and should not be used as the sole basis for treatment or other patient management decisions. A negative result may occur with  improper specimen collection/handling, submission of specimen other than nasopharyngeal swab, presence of viral mutation(s) within the areas targeted by this assay, and inadequate number of viral copies(<138 copies/mL). A negative result must be combined with clinical observations, patient history, and epidemiological information. The expected result is Negative.  Fact Sheet for Patients:   EntrepreneurPulse.com.au  Fact Sheet for Healthcare Providers:  IncredibleEmployment.be  This test is no t yet approved or cleared by the [place] FDA and  has been authorized for detection and/or diagnosis of SARS-CoV-2 by FDA under an Emergency Use Authorization (EUA). This EUA will remain  in effect (meaning this test can be used) for the duration of the COVID-19 declaration under Section 564(b)(1) of the Act, 21 U.S.C.section 360bbb-3(b)(1), unless the authorization is terminated  or revoked sooner.       Influenza A by PCR NEGATIVE NEGATIVE Final   Influenza B by PCR NEGATIVE NEGATIVE Final    Comment: (NOTE) The Xpert Xpress SARS-CoV-2/FLU/RSV plus assay is intended as an aid in the diagnosis of influenza from Nasopharyngeal swab specimens and should not be used as a sole basis for treatment. Nasal washings and aspirates are unacceptable for Xpert Xpress SARS-CoV-2/FLU/RSV testing.  Fact Sheet for Patients: EntrepreneurPulse.com.au  Fact Sheet for Healthcare Providers: IncredibleEmployment.be  This test is not yet approved or cleared by the [place] FDA and has been authorized for detection and/or diagnosis of SARS-CoV-2 by FDA under an Emergency Use Authorization (EUA). This EUA will remain in effect (meaning this test can be used) for the duration of the COVID-19 declaration under Section 564(b)(1) of the Act, 21 U.S.C. section 360bbb-3(b)(1), unless the authorization is terminated or revoked.  Performed at [facility], [place] [address], [place], [place] [address]          Radiology Studies: No results found.      Scheduled Meds: . pantoprazole (PROTONIX) IV  40 mg Intravenous Q12H   Continuous Infusions: . acetaminophen    . dextrose 75 mL/hr at 01/24/21 0406  . piperacillin-tazobactam (ZOSYN)  IV 3.375 g (01/24/21 0508)     LOS: 3 days    Time  spent: 35 mins,More than 50% of that time was spent in counseling and/or coordination of care.      Shelly Coss, MD [facility] P4/25/2022, 8:04 AM

## 2021-01-25 DIAGNOSIS — K223 Perforation of esophagus: Secondary | ICD-10-CM | POA: Diagnosis not present

## 2021-01-25 MED ORDER — ALPRAZOLAM 0.5 MG PO TABS: 0.5000 mg | ORAL_TABLET | Freq: Every evening | ORAL | Status: DC | PRN

## 2021-01-25 MED ORDER — PANTOPRAZOLE SODIUM 40 MG PO TBEC
40.0000 mg | DELAYED_RELEASE_TABLET | Freq: Every day | ORAL | Status: DC
  Administered 2021-01-26: 40 mg via ORAL
  Filled 2021-01-25: qty 1

## 2021-01-25 MED ORDER — LOSARTAN POTASSIUM-HCTZ 50-12.5 MG PO TABS: 1.0000 | ORAL_TABLET | Freq: Every day | ORAL | Status: DC

## 2021-01-25 MED ORDER — ZOLPIDEM TARTRATE 5 MG PO TABS
5.0000 mg | ORAL_TABLET | Freq: Every evening | ORAL | Status: DC | PRN
  Administered 2021-01-25: 5 mg via ORAL
  Filled 2021-01-25: qty 1

## 2021-01-25 NOTE — Progress Notes (Addendum)
PROGRESS NOTE    Cathy Goodwin  XBM:[id number] DOB: 23-Apr-1956 DOA: 01/21/2021 PCP: Loraine Leriche., MD   Chief Complain: Abdominal pain  Brief Narrative: Patient is a 65 year old female with history of hypertension who had an EGD/colonoscopy procedure done as an outpatient at [facility] on 01/21/2021, seen in the emergency department on the way back to home because she developed severe abdominal pain while driving in the car and passed out.  She was suspected to have esophageal perforation on imagings done at the emergency department and she was sent to [facility].  CT abdomen/pelvis had shown fluid and a small amount of extraluminal air within the posterior mediastinum adjacent to the distal esophagus, indicative of an esophageal perforation.CT chest showed  wall thickening of the distal esophagus with small volume of pneumomediastinum at and just beyond the gastroesophageal junction, raising concern for esophageal perforation.  She was admitted, cardiothoracic surgery consulted.  Initiated  conservative management.  She was started on IV fluids, antibiotics. Underwent esophagram on 01/24/21 without finding of any leak.  Diet gradually advanced.  Plan for discharge tomorrow if she continues to remain stable.  Assessment & Plan:   Principal Problem:   Esophageal perforation Active Problems:   Essential (primary) hypertension   Suspected esophageal perforation:EGD/colonoscopy procedure done as an outpatient at [facility] on 01/21/2021, seen in the emergency department on the way back to home because she developed severe abdominal pain while driving in the car and passed out.  She was suspected to have esophageal perforation on imagings done at the emergency department and she was sent to [facility].  CT abdomen/pelvis had shown fluid and a small amount of extraluminal air within the posterior mediastinum adjacent to the distal esophagus, indicative of an esophageal perforation.CT chest  showed  wall thickening of the distal esophagus with small volume of pneumomediastinum at and just beyond the gastroesophageal junction, raising concern for esophageal perforation.  She was admitted, cardiothoracic surgery consulted.  Initiated  conservative management.  She was started on IV fluids, antibiotics. Underwent esophagram on 01/24/21 without finding of any leak.  Diet gradually advanced.  Plan for discharge tomorrow if she continues to remain stable.  She is tolerating soft diet.  Denies any abdominal or chest pain. Antibiotics can be stopped tomorrow, she does not need any antibiotics on discharge  Esophageal narrowing/irregularity: As seen on the sophagram.  We recommend to follow-up with her own gastroenterologist as an outpatient.  Continue PPI discharge  Hypertension: Monitor blood pressure.  Currently stable.  Use as needed IV meds for severe hypertension  Leukocytosis.  Resolved.           DVT prophylaxis:SCD Code Status: Full Family Communication: None present at the bedside Status is: Inpatient  Remains inpatient appropriate because:Inpatient level of care appropriate due to severity of illness   Dispo: The patient is from: Home              Anticipated d/c is to: Home              Patient currently is not medically stable to d/c.   Difficult to place patient No  DC tomorrow to home if tolerates soft food   Consultants: Cardiothoracic surgery  Procedures:None  Antimicrobials:  Anti-infectives (From admission, onward)   Start     Dose/Rate Route Frequency Ordered Stop   01/22/21 0400  piperacillin-tazobactam (ZOSYN) IVPB 3.375 g        3.375 g 12.5 mL/hr over 240 Minutes Intravenous Every 8 hours  01/22/21 0226     01/21/21 1500  vancomycin (VANCOREADY) IVPB 1500 mg/300 mL        1,500 mg 150 mL/hr over 120 Minutes Intravenous  Once 01/21/21 1409 01/21/21 1723   01/21/21 1415  piperacillin-tazobactam (ZOSYN) IVPB 3.375 g        3.375 g 100 mL/hr over  30 Minutes Intravenous STAT 01/21/21 1409 01/21/21 1451      Subjective:  Patient seen and examined the bedside this morning.  Hemodynamically stable denies any complaints today.  Very comfortable.  Sitting near the window.  Ordering soft diet  Objective: Vitals:   01/24/21 0411 01/24/21 1453 01/24/21 2001 01/25/21 0506  BP: 116/73 127/77 120/71 139/79  Pulse: (!) 56 (!) 58 65 64  Resp: 17 17 18 15   Temp: 98.4 F (36.9 C) 98.3 F (36.8 C) 98.4 F (36.9 C) 98.4 F (36.9 C)  TempSrc: Oral Oral Oral Oral  SpO2: 95% 100% 99% 97%  Weight:      Height:        Intake/Output Summary (Last 24 hours) at 01/25/2021 0830 Last data filed at 01/25/2021 0600 Gross per 24 hour  Intake 354.38 ml  Output --  Net 354.38 ml   Filed Weights   01/21/21 0943  Weight: 79.4 kg    Examination:  General exam: Overall comfortable, not in distress, pleasant  female, obese HEENT: PERRL Respiratory system:  no wheezes or crackles  Cardiovascular system: S1 & S2 heard, RRR.  Gastrointestinal system: Abdomen is nondistended, soft and nontender. Central nervous system: Alert and oriented Extremities: No edema, no clubbing ,no cyanosis Skin: No rashes, no ulcers,no icterus     Data Reviewed: I have personally reviewed following labs and imaging studies  CBC: Recent Labs  Lab 01/21/21 0952 01/22/21 0221 01/23/21 0254 01/24/21 0612  WBC 4.8 10.8* 7.2 5.5  NEUTROABS 3.4  --  5.0  --   HGB 13.9 12.7 11.8* 12.6  HCT 42.1 38.6 36.6 37.6  MCV 94.8 95.5 96.8 93.8  PLT 318 284 247 754   Basic Metabolic Panel: Recent Labs  Lab 01/21/21 0952 01/22/21 0221 01/23/21 0254  NA 140 139 136  K 4.1 4.7 3.6  CL 108 106 105  CO2 23 25 25   GLUCOSE 164* 130* 132*  BUN 12 11 8   CREATININE 0.99 0.96 0.92  CALCIUM 9.4 9.1 8.7*   GFR: Estimated Creatinine Clearance: 61.1 mL/min (by C-G formula based on SCr of 0.92 mg/dL). Liver Function Tests: Recent Labs  Lab 01/21/21 0952  AST 23  ALT 27   ALKPHOS 84  BILITOT 1.2  PROT 7.8  ALBUMIN 4.3   Recent Labs  Lab 01/21/21 0952  LIPASE 34   No results for input(s): AMMONIA in the last 168 hours. Coagulation Profile: No results for input(s): INR, PROTIME in the last 168 hours. Cardiac Enzymes: No results for input(s): CKTOTAL, CKMB, CKMBINDEX, TROPONINI in the last 168 hours. BNP (last 3 results) No results for input(s): PROBNP in the last 8760 hours. HbA1C: No results for input(s): HGBA1C in the last 72 hours. CBG: No results for input(s): GLUCAP in the last 168 hours. Lipid Profile: No results for input(s): CHOL, HDL, LDLCALC, TRIG, CHOLHDL, LDLDIRECT in the last 72 hours. Thyroid Function Tests: No results for input(s): TSH, T4TOTAL, FREET4, T3FREE, THYROIDAB in the last 72 hours. Anemia Panel: No results for input(s): VITAMINB12, FOLATE, FERRITIN, TIBC, IRON, RETICCTPCT in the last 72 hours. Sepsis Labs: No results for input(s): PROCALCITON, LATICACIDVEN in the last 168  hours.  Recent Results (from the past 240 hour(s))  Resp Panel by RT-PCR (Flu A&B, Covid) Nasopharyngeal Swab     Status: None   Collection Time: 01/21/21  8:00 PM   Specimen: Nasopharyngeal Swab; Nasopharyngeal(NP) swabs in vial transport medium  Result Value Ref Range Status   SARS Coronavirus 2 by RT PCR NEGATIVE NEGATIVE Final    Comment: (NOTE) SARS-CoV-2 target nucleic acids are NOT DETECTED.  The SARS-CoV-2 RNA is generally detectable in upper respiratory specimens during the acute phase of infection. The lowest concentration of SARS-CoV-2 viral copies this assay can detect is 138 copies/mL. A negative result does not preclude SARS-Cov-2 infection and should not be used as the sole basis for treatment or other patient management decisions. A negative result may occur with  improper specimen collection/handling, submission of specimen other than nasopharyngeal swab, presence of viral mutation(s) within the areas targeted by this assay,  and inadequate number of viral copies(<138 copies/mL). A negative result must be combined with clinical observations, patient history, and epidemiological information. The expected result is Negative.  Fact Sheet for Patients:  EntrepreneurPulse.com.au  Fact Sheet for Healthcare Providers:  IncredibleEmployment.be  This test is no t yet approved or cleared by the [place] FDA and  has been authorized for detection and/or diagnosis of SARS-CoV-2 by FDA under an Emergency Use Authorization (EUA). This EUA will remain  in effect (meaning this test can be used) for the duration of the COVID-19 declaration under Section 564(b)(1) of the Act, 21 U.S.C.section 360bbb-3(b)(1), unless the authorization is terminated  or revoked sooner.       Influenza A by PCR NEGATIVE NEGATIVE Final   Influenza B by PCR NEGATIVE NEGATIVE Final    Comment: (NOTE) The Xpert Xpress SARS-CoV-2/FLU/RSV plus assay is intended as an aid in the diagnosis of influenza from Nasopharyngeal swab specimens and should not be used as a sole basis for treatment. Nasal washings and aspirates are unacceptable for Xpert Xpress SARS-CoV-2/FLU/RSV testing.  Fact Sheet for Patients: EntrepreneurPulse.com.au  Fact Sheet for Healthcare Providers: IncredibleEmployment.be  This test is not yet approved or cleared by the [place] FDA and has been authorized for detection and/or diagnosis of SARS-CoV-2 by FDA under an Emergency Use Authorization (EUA). This EUA will remain in effect (meaning this test can be used) for the duration of the COVID-19 declaration under Section 564(b)(1) of the Act, 21 U.S.C. section 360bbb-3(b)(1), unless the authorization is terminated or revoked.  Performed at [facility], [place] [address], [place], [place] [address]          Radiology Studies: DG ESOPHAGUS W SINGLE CM (SOL OR THIN  BA)  Addendum Date: 01/24/2021   ADDENDUM REPORT: 01/24/2021 12:12 ADDENDUM: Mild irregularity of the distal esophagus with some mild narrowing suggested. This could be due to edema and postprocedural changes (as outlined in the original report) and/or residual stricture. Correlation with endoscopy results is suggested. These results will be called to the ordering clinician or representative by the Radiologist Assistant, and communication documented in the PACS or [employer]. Electronically Signed   By: Zetta Bills M.D.   On: 01/24/2021 12:12   Result Date: 01/24/2021 CLINICAL DATA:  A 65 year old female with history of suspected esophageal perforation following endoscopy. Recent negative CT for leak. EXAM: ESOPHOGRAM/BARIUM SWALLOW TECHNIQUE: Single contrast examination was performed using first using water-soluble contrast followed subsequently with thin barium contrast material. FLUOROSCOPY TIME:  Fluoroscopy Time:  2 minutes 54 seconds Radiation Exposure Index (if provided by  the fluoroscopic device): 37.4 mGy Number of Acquired Spot Images: 0 COMPARISON:  CT of the chest from January 22, 2019 to FINDINGS: Water-soluble contrast was administered followed by thin barium contrast material. Images were first performed in head of bed slightly elevated RAO and LPO positions showing no leak. AP positioning was also performed along with prone RAO as well positioning given the potential for leak along the anterior esophagus. None of these projections showed evidence contrast extravasation. Mild irregularity near the GE junction was noted potentially related to edema or recent procedure. IMPRESSION: No sign of esophageal leak. Mild irregularity distally potentially due to edema and postprocedural changes. Electronically Signed: By: Zetta Bills M.D. On: 01/24/2021 09:54        Scheduled Meds: . pantoprazole (PROTONIX) IV  40 mg Intravenous Q12H   Continuous Infusions: . dextrose 5 % and 0.9% NaCl  75 mL/hr at 01/25/21 0007  . piperacillin-tazobactam (ZOSYN)  IV 3.375 g (01/25/21 0504)     LOS: 4 days    Time spent: 25 mins,More than 50% of that time was spent in counseling and/or coordination of care.      Shelly Coss, MD [facility] P4/26/2022, 8:30 AM

## 2021-01-25 NOTE — Progress Notes (Addendum)
      [address]       [place] [address]             [phone]         Subjective: Awake and alert, resting in bed. Says she feels well and has had no difficulty swallowing clear liquids. Describes a vague epigastric fullness but no chest or abdominal pain.   Objective: Vital signs in last 24 hours: Temp:  [98.3 F (36.8 C)-98.4 F (36.9 C)] 98.4 F (36.9 C) (04/26 0506) Pulse Rate:  [58-65] 64 (04/26 0506) Resp:  [15-18] 15 (04/26 0506) BP: (120-139)/(71-79) 139/79 (04/26 0506) SpO2:  [97 %-100 %] 97 % (04/26 0506)    Intake/Output from previous day: 04/25 0701 - 04/26 0700 In: 354.4 [P.O.:120; IV Piggyback:234.4] Out: -  Intake/Output this shift: No intake/output data recorded.  General appearance: alert, cooperative and no distress Neurologic: intact Heart: RRR Abdomen: soft and non-tender  Lab Results: Recent Labs    01/23/21 0254 01/24/21 0612  WBC 7.2 5.5  HGB 11.8* 12.6  HCT 36.6 37.6  PLT 247 257   BMET:  Recent Labs    01/23/21 0254  NA 136  K 3.6  CL 105  CO2 25  GLUCOSE 132*  BUN 8  CREATININE 0.92  CALCIUM 8.7*    PT/INR: No results for input(s): LABPROT, INR in the last 72 hours. ABG No results found for: PHART, HCO3, TCO2, ACIDBASEDEF, O2SAT CBG (last 3)  No results for input(s): GLUCAP in the last 72 hours.  Assessment/Plan:  -Distal esophageal perforation without extravasation post EGD on 4/22. Esophagram yesterday showed no evidence of leak and she has tolerated clear liquids well. Will advance to soft diet today. If this is tolerated, OK to plan for discharge tomorrow.     LOS: 4 days    Malon Kindle [phone] 01/25/2021   Chart reviewed, patient examined, agree with above. Had a good day. No abdominal pain or nausea. Ate scrambled eggs and breakfast potatoes this am, chicken salad and noodle soup for lunch. No difficulty swallowing. She can go home in am and already has followup with  her PCP Thursday.

## 2021-01-25 NOTE — Plan of Care (Signed)

## 2021-01-26 DIAGNOSIS — K223 Perforation of esophagus: Secondary | ICD-10-CM | POA: Diagnosis not present

## 2021-01-26 LAB — CBC WITH DIFFERENTIAL/PLATELET
Abs Immature Granulocytes: 0.01 10*3/uL (ref 0.00–0.07)
Basophils Absolute: 0 10*3/uL (ref 0.0–0.1)
Basophils Relative: 0 %
Eosinophils Absolute: 0.2 10*3/uL (ref 0.0–0.5)
Eosinophils Relative: 4 %
HCT: 32.6 % — ABNORMAL LOW (ref 36.0–46.0)
Hemoglobin: 10.8 g/dL — ABNORMAL LOW (ref 12.0–15.0)
Immature Granulocytes: 0 %
Lymphocytes Relative: 30 %
Lymphs Abs: 1.5 10*3/uL (ref 0.7–4.0)
MCH: 31.2 pg (ref 26.0–34.0)
MCHC: 33.1 g/dL (ref 30.0–36.0)
MCV: 94.2 fL (ref 80.0–100.0)
Monocytes Absolute: 0.5 10*3/uL (ref 0.1–1.0)
Monocytes Relative: 10 %
Neutro Abs: 2.8 10*3/uL (ref 1.7–7.7)
Neutrophils Relative %: 56 %
Platelets: 253 10*3/uL (ref 150–400)
RBC: 3.46 MIL/uL — ABNORMAL LOW (ref 3.87–5.11)
RDW: 12.4 % (ref 11.5–15.5)
WBC: 5.1 10*3/uL (ref 4.0–10.5)
nRBC: 0 % (ref 0.0–0.2)

## 2021-01-26 LAB — BASIC METABOLIC PANEL
Anion gap: 8 (ref 5–15)
BUN: 5 mg/dL — ABNORMAL LOW (ref 8–23)
CO2: 25 mmol/L (ref 22–32)
Calcium: 8.4 mg/dL — ABNORMAL LOW (ref 8.9–10.3)
Chloride: 107 mmol/L (ref 98–111)
Creatinine, Ser: 0.81 mg/dL (ref 0.44–1.00)
GFR, Estimated: >60 mL/min (ref >60)
Glucose, Bld: 112 mg/dL — ABNORMAL HIGH (ref 70–99)
Potassium: 3.4 mmol/L — ABNORMAL LOW (ref 3.5–5.1)
Sodium: 140 mmol/L (ref 135–145)

## 2021-01-26 MED ORDER — POTASSIUM CHLORIDE CRYS ER 20 MEQ PO TBCR
20.0000 meq | EXTENDED_RELEASE_TABLET | Freq: Once | ORAL | Status: AC
  Administered 2021-01-26: 20 meq via ORAL
  Filled 2021-01-26: qty 1

## 2021-01-26 NOTE — Progress Notes (Signed)
      [address]       [place] [address]             [phone]         Subjective: Sitting up eating breakfast. Has had no difficulty swallowing after resuming soft diet yesterday. Feels well and is anxious to return home.   Objective: Vital signs in last 24 hours: Temp:  [97.7 F (36.5 C)-98.5 F (36.9 C)] 98.1 F (36.7 C) (04/27 0700) Pulse Rate:  [61-67] 67 (04/27 0700) Resp:  [18-19] 18 (04/27 0700) BP: (123-145)/(70-85) 123/70 (04/27 0700) SpO2:  [96 %-100 %] 99 % (04/27 0700)    Intake/Output from previous day: 04/26 0701 - 04/27 0700 In: 480 [P.O.:480] Out: -  Intake/Output this shift: No intake/output data recorded.  General appearance: alert, cooperative and no distress Neurologic: intact Heart: RRR   Lab Results: Recent Labs    01/24/21 0612 01/26/21 0255  WBC 5.5 5.1  HGB 12.6 10.8*  HCT 37.6 32.6*  PLT 257 253   BMET:  Recent Labs    01/26/21 0255  NA 140  K 3.4*  CL 107  CO2 25  GLUCOSE 112*  BUN 5*  CREATININE 0.81  CALCIUM 8.4*    PT/INR: No results for input(s): LABPROT, INR in the last 72 hours. ABG No results found for: PHART, HCO3, TCO2, ACIDBASEDEF, O2SAT CBG (last 3)  No results for input(s): GLUCAP in the last 72 hours.  Assessment/Plan:  -Distal esophageal perforation without extravasation post EGD on 4/22. Esophagram 4/25 showed no evidence of leak.  Diet has been gradually advanced and well tolerated. Agree with plan for discharge today, no need for continued antibiotic from CTS standpoint.     LOS: 5 days    Antony Odea, [place] [phone] 01/26/2021

## 2021-01-26 NOTE — Discharge Summary (Signed)
Cathy Goodwin ZOX:[id number] DOB: November 10, 1955 DOA: 01/21/2021  PCP: Cathy Goodwin., MD  Admit date: 01/21/2021 Discharge date: 01/26/2021  Admitted From: home Disposition:  home  Recommendations for Outpatient Follow-up:  1. Follow up with PCP in 1 week 2. Please obtain BMP/CBC in one week       Discharge Condition:Stable CODE STATUS:full  Diet recommendation: Heart Healthy Brief Summary:  Per WJX:BJYNW Goodwin is a 65 y.o. female with medical history significant of hypertension had a colonoscopy and EGD for abdominal pain and routine colon cancer screening was sent home with severe epigastric abdominal pain.  Patient with home was driving in the car when she became passed out was having such extreme pain her husband drove her straight to this emergency department which was the closest emergency department.  Patient found to have an esophageal perforation.  Patient reported that the procedure was done at [facility] at an outside facility and there were several biopsies that were taken.  CT surgery at [facility] has been called are recommending doing a specific CT of the chest and transferring to [facility] for further evaluation.   Suspected esophageal perforation: CT abdomen/pelvis had shown fluid and a small amount of extraluminal air within the posterior mediastinum adjacent to the distal esophagus, indicative of an esophageal perforation.CT chest showed  wall thickening of the distal esophagus with small volume of pneumomediastinum at and just beyond the gastroesophageal junction, raising concern for esophageal perforation.  She was admitted, cardiothoracic surgery consulted.  Initiated  conservative management.  She was started on IV fluids, antibiotics. Underwent esophagram on 01/24/21 without finding of any leak.  Diet gradually advanced.   She tolerated her p.o. intake without any issues with difficulty swallowing.  Feels well.  CT surgery was okay with patient being  discharged home and no need for antibiotics.   Esophageal narrowing/irregularity: As seen on the sophagram.  We recommend to follow-up with her own gastroenterologist as an outpatient.  Continue PPI discharge  Hypertension: Stable.  Not on her blood pressure medications here.  Monitor blood pressure to see if elevates and discussed with primary care about resuming when appropriate.   Leukocytosis.    Reactive.  Resolved  Hypokalemia-mild.  Supplemented   Discharge Diagnoses:  Principal Problem:   Esophageal perforation Active Problems:   Essential (primary) hypertension    Discharge Instructions  Discharge Instructions    Call MD for:  severe uncontrolled pain   Complete by: As directed    Call MD for:  temperature >100.4   Complete by: As directed    Diet - low sodium heart healthy   Complete by: As directed    Discharge instructions   Complete by: As directed    F/u with pcp. Monitor bp to see if starts rising.   Increase activity slowly   Complete by: As directed      Allergies as of 01/26/2021   No Known Allergies     Medication List    STOP taking these medications   baclofen 10 MG tablet Commonly known as: LIORESAL   losartan-hydrochlorothiazide 50-12.5 MG tablet Commonly known as: HYZAAR   meloxicam 15 MG tablet Commonly known as: Mobic   traMADol 50 MG tablet Commonly known as: ULTRAM     TAKE these medications   acetaminophen 500 MG tablet Commonly known as: TYLENOL Take 500 mg by mouth every 6 (six) hours as needed for moderate pain.   ALPRAZolam 0.5 MG tablet Commonly known as: XANAX Take 0.5 mg by mouth  at bedtime as needed for anxiety or sleep.   azelastine 0.1 % nasal spray Commonly known as: ASTELIN Place 2 sprays into both nostrils daily as needed for allergies.   famotidine 20 MG tablet Commonly known as: PEPCID Take 20 mg by mouth at bedtime.   fluticasone 50 MCG/ACT nasal spray Commonly known as: FLONASE Place 1 spray into  both nostrils 2 (two) times daily. What changed: when to take this   montelukast 10 MG tablet Commonly known as: SINGULAIR Take 1 tablet by mouth daily as needed (allergies).   omeprazole 40 MG capsule Commonly known as: PRILOSEC Take 1 capsule by mouth 2 (two) times daily as needed (indigestion/acid reflux).   Vitamin D (Ergocalciferol) 1.25 MG (50000 UNIT) Caps capsule Commonly known as: DRISDOL Take 1 capsule by mouth once a week.   zolpidem 10 MG tablet Commonly known as: AMBIEN Take 5-10 mg by mouth at bedtime as needed for sleep.       Follow-up Information    Cathy Goodwin., MD Follow up in 1 week(s).   Specialty: Internal Medicine Contact information: [place] [address] [phone]              No Known Allergies  Consultations:  CT surgery   Procedures/Studies: CT Chest Wo Contrast  Result Date: 01/21/2021 CLINICAL DATA:  Esophageal perforation on CT abdomen/pelvis. Pain after upper endoscopy and colonoscopy 1 day ago. EXAM: CT CHEST WITHOUT CONTRAST TECHNIQUE: Multidetector CT imaging of the chest was performed following the standard protocol without IV contrast. COMPARISON:  Abdominopelvic CT earlier today. FINDINGS: Cardiovascular: Normal heart size. Coronary artery calcifications. No pericardial effusion. Atherosclerosis of the thoracic aorta. No aortic aneurysm. Conventional branching pattern from the aortic arch. Mediastinum/Nodes: Administered enteric contrast opacifies the mid and distal esophagus. There is no evidence of contrast extravasation. Wall thickening of the distal esophagus with small foci of pneumomediastinum at and just beyond the gastroesophageal junction, series 5, images 94 and 100. The degree of pneumomediastinum has improved from abdominal CT earlier today. Small amount of fluid right posterolateral esophagus as seen on CT. No contrast extends into the paraesophageal fluid. No bulky mediastinal or obvious hilar  adenopathy. No thyroid nodule. Lungs/Pleura: No pneumothorax. Dependent atelectasis within both lower lobes without confluent consolidation. Minimal right pleural thickening, no significant pleural fluid. Trachea and central bronchi are patent. No pulmonary edema. Calcified granuloma noted in the right middle lobe. Upper Abdomen: Assessed and abdominopelvic CT earlier today. Enteric contrast in the stomach without evidence of gastric extravasation. Excreted IV contrast within both renal collecting systems. Musculoskeletal: There are no acute or suspicious osseous abnormalities. IMPRESSION: 1. No extravasation of enteric contrast from the esophagus. There is wall thickening of the distal esophagus with small volume of pneumomediastinum at and just beyond the gastroesophageal junction, raising concern for esophageal perforation. The degree of pneumomediastinum has improved from CT earlier today. 2. Dependent atelectasis in both lower lobes. 3. Aortic atherosclerosis.  Coronary artery calcifications. Aortic Atherosclerosis (ICD10-I70.0). Electronically Signed   By: Keith Rake M.D.   On: 01/21/2021 16:21   CT ABDOMEN PELVIS W CONTRAST  Result Date: 01/21/2021 CLINICAL DATA:  Abdominal pain following upper endoscopy and colonoscopy 1 day ago EXAM: CT ABDOMEN AND PELVIS WITH CONTRAST TECHNIQUE: Multidetector CT imaging of the abdomen and pelvis was performed using the standard protocol following bolus administration of intravenous contrast. CONTRAST:  114m OMNIPAQUE IOHEXOL 300 MG/ML  SOLN COMPARISON:  None. FINDINGS: Lower chest: Fluid present within the posterior mediastinum  adjacent to the distal esophagus where there is also a small amount of extraluminal air (series 2, images 12-15). Mild streaky dependent bibasilar opacities, likely atelectasis. Heart size within normal limits. Hepatobiliary: Mildly decreased attenuation of the hepatic parenchyma suggesting hepatic steatosis. No focal hepatic lesion  identified. Unremarkable gallbladder. No hyperdense gallstone. No biliary dilatation. Pancreas: Unremarkable. No pancreatic ductal dilatation or surrounding inflammatory changes. Spleen: Normal in size without focal abnormality. Adrenals/Urinary Tract: Unremarkable adrenal glands. Kidneys enhance symmetrically without focal lesion, stone, or hydronephrosis. Ureters are nondilated. Urinary bladder is decompressed, limiting its evaluation. Stomach/Bowel: Stomach is unremarkable. No dilated loops of small bowel to suggest obstruction. Diverticulosis predominantly involving the sigmoid colon. Submucosal fat deposition is present within the cecum and ascending colon. Mild long segment thickening within the sigmoid colon, which may be reactive secondary to recent colonoscopy. No pericolonic inflammatory changes. Normal appendix is present in the right lower quadrant (series 5, image 92) Vascular/Lymphatic: No significant vascular findings are present. No enlarged abdominal or pelvic lymph nodes. Reproductive: Uterus and bilateral adnexa are unremarkable. Other: No free fluid. No abdominopelvic fluid collection. Single focus of intraperitoneal air adjacent to the esophageal hiatus (series 4, image 43. No gross pneumoperitoneum. No abdominal wall hernia. Musculoskeletal: No acute or significant osseous findings. IMPRESSION: 1. Fluid and a small amount of extraluminal air within the posterior mediastinum adjacent to the distal esophagus. Findings indicative of an esophageal perforation. 2. No gross free intraperitoneal air. 3. Mild long segment thickening within the sigmoid colon, which may be reactive secondary to recent colonoscopy. No evidence of large bowel perforation. 4. Submucosal fat deposition within the cecum and ascending colon, which can be seen with chronic inflammatory bowel disease. 5. Hepatic steatosis. These results were called by telephone at the time of interpretation on 01/21/2021 at 1:22 pm to provider  CLAUDIA GIBBONS , who verbally acknowledged these results. Electronically Signed   By: Davina Poke D.O.   On: 01/21/2021 13:24   DG CHEST PORT 1 VIEW  Result Date: 01/22/2021 CLINICAL DATA:  Esophageal perforation. EXAM: PORTABLE CHEST 1 VIEW COMPARISON:  01/21/2021 FINDINGS: Mild cardiomegaly remains stable. No evidence of pneumomediastinum or pneumothorax. No evidence of pulmonary infiltrate or pleural effusion. IMPRESSION: Stable mild cardiomegaly. No active lung disease. Electronically Signed   By: Marlaine Hind M.D.   On: 01/22/2021 08:26   DG Abd Acute W/Chest  Result Date: 01/21/2021 CLINICAL DATA:  Epigastric pain after EGD and colonoscopy EXAM: DG ABDOMEN ACUTE WITH 1 VIEW CHEST COMPARISON:  None. FINDINGS: Under penetrated study. No visible pneumoperitoneum or pneumatosis. Low volume chest with interstitial crowding. There is no edema, consolidation, effusion, or pneumothorax. Normal heart size and mediastinal contours. No abnormal gas distension. No concerning mass effect or calcification. IMPRESSION: Unremarkable portable study. No visible pneumoperitoneum or excessive gas retention. Electronically Signed   By: Monte Fantasia M.D.   On: 01/21/2021 10:54   DG ESOPHAGUS W SINGLE CM (SOL OR THIN BA)  Addendum Date: 01/24/2021   ADDENDUM REPORT: 01/24/2021 12:12 ADDENDUM: Mild irregularity of the distal esophagus with some mild narrowing suggested. This could be due to edema and postprocedural changes (as outlined in the original report) and/or residual stricture. Correlation with endoscopy results is suggested. These results will be called to the ordering clinician or representative by the Radiologist Assistant, and communication documented in the PACS or [employer]. Electronically Signed   By: Zetta Bills M.D.   On: 01/24/2021 12:12   Result Date: 01/24/2021 CLINICAL DATA:  A 65 year old female with  history of suspected esophageal perforation following endoscopy. Recent negative  CT for leak. EXAM: ESOPHOGRAM/BARIUM SWALLOW TECHNIQUE: Single contrast examination was performed using first using water-soluble contrast followed subsequently with thin barium contrast material. FLUOROSCOPY TIME:  Fluoroscopy Time:  2 minutes 54 seconds Radiation Exposure Index (if provided by the fluoroscopic device): 37.4 mGy Number of Acquired Spot Images: 0 COMPARISON:  CT of the chest from January 22, 2019 to FINDINGS: Water-soluble contrast was administered followed by thin barium contrast material. Images were first performed in head of bed slightly elevated RAO and LPO positions showing no leak. AP positioning was also performed along with prone RAO as well positioning given the potential for leak along the anterior esophagus. None of these projections showed evidence contrast extravasation. Mild irregularity near the GE junction was noted potentially related to edema or recent procedure. IMPRESSION: No sign of esophageal leak. Mild irregularity distally potentially due to edema and postprocedural changes. Electronically Signed: By: Zetta Bills M.D. On: 01/24/2021 09:54       Subjective: Tolerated p.o. intake no dysphagia.  No chest pain.  No abdominal pain  Discharge Exam: Vitals:   01/26/21 0400 01/26/21 0700  BP: 131/85 123/70  Pulse: 66 67  Resp: 18 18  Temp: 98.5 F (36.9 C) 98.1 F (36.7 C)  SpO2: 96% 99%   Vitals:   01/25/21 1500 01/25/21 2221 01/26/21 0400 01/26/21 0700  BP: 132/72 (!) 145/77 131/85 123/70  Pulse: 66 61 66 67  Resp: 18 19 18 18   Temp: 98.2 F (36.8 C) 97.7 F (36.5 C) 98.5 F (36.9 C) 98.1 F (36.7 C)  TempSrc: Oral Oral Oral Oral  SpO2: 100% 98% 96% 99%  Weight:      Height:        General: Pt is alert, awake, not in acute distress Cardiovascular: RRR, S1/S2 +, no rubs, no gallops Respiratory: CTA bilaterally, no wheezing, no rhonchi Abdominal: Soft, NT, ND, bowel sounds + Extremities: no edema, no cyanosis    The results of significant  diagnostics from this hospitalization (including imaging, microbiology, ancillary and laboratory) are listed below for reference.     Microbiology: Recent Results (from the past 240 hour(s))  Resp Panel by RT-PCR (Flu A&B, Covid) Nasopharyngeal Swab     Status: None   Collection Time: 01/21/21  8:00 PM   Specimen: Nasopharyngeal Swab; Nasopharyngeal(NP) swabs in vial transport medium  Result Value Ref Range Status   SARS Coronavirus 2 by RT PCR NEGATIVE NEGATIVE Final    Comment: (NOTE) SARS-CoV-2 target nucleic acids are NOT DETECTED.  The SARS-CoV-2 RNA is generally detectable in upper respiratory specimens during the acute phase of infection. The lowest concentration of SARS-CoV-2 viral copies this assay can detect is 138 copies/mL. A negative result does not preclude SARS-Cov-2 infection and should not be used as the sole basis for treatment or other patient management decisions. A negative result may occur with  improper specimen collection/handling, submission of specimen other than nasopharyngeal swab, presence of viral mutation(s) within the areas targeted by this assay, and inadequate number of viral copies(<138 copies/mL). A negative result must be combined with clinical observations, patient history, and epidemiological information. The expected result is Negative.  Fact Sheet for Patients:  EntrepreneurPulse.com.au  Fact Sheet for Healthcare Providers:  IncredibleEmployment.be  This test is no t yet approved or cleared by the [place] FDA and  has been authorized for detection and/or diagnosis of SARS-CoV-2 by FDA under an Emergency Use Authorization (EUA). This EUA will remain  in effect (meaning this test can be used) for the duration of the COVID-19 declaration under Section 564(b)(1) of the Act, 21 U.S.C.section 360bbb-3(b)(1), unless the authorization is terminated  or revoked sooner.       Influenza A by PCR NEGATIVE  NEGATIVE Final   Influenza B by PCR NEGATIVE NEGATIVE Final    Comment: (NOTE) The Xpert Xpress SARS-CoV-2/FLU/RSV plus assay is intended as an aid in the diagnosis of influenza from Nasopharyngeal swab specimens and should not be used as a sole basis for treatment. Nasal washings and aspirates are unacceptable for Xpert Xpress SARS-CoV-2/FLU/RSV testing.  Fact Sheet for Patients: EntrepreneurPulse.com.au  Fact Sheet for Healthcare Providers: IncredibleEmployment.be  This test is not yet approved or cleared by the [place] FDA and has been authorized for detection and/or diagnosis of SARS-CoV-2 by FDA under an Emergency Use Authorization (EUA). This EUA will remain in effect (meaning this test can be used) for the duration of the COVID-19 declaration under Section 564(b)(1) of the Act, 21 U.S.C. section 360bbb-3(b)(1), unless the authorization is terminated or revoked.  Performed at [facility], [place] [address], [place], [place] [address]      Labs: BNP (last 3 results) No results for input(s): BNP in the last 8760 hours. Basic Metabolic Panel: Recent Labs  Lab 01/21/21 0952 01/22/21 0221 01/23/21 0254 01/26/21 0255  NA 140 139 136 140  K 4.1 4.7 3.6 3.4*  CL 108 106 105 107  CO2 23 25 25 25   GLUCOSE 164* 130* 132* 112*  BUN 12 11 8  5*  CREATININE 0.99 0.96 0.92 0.81  CALCIUM 9.4 9.1 8.7* 8.4*   Liver Function Tests: Recent Labs  Lab 01/21/21 0952  AST 23  ALT 27  ALKPHOS 84  BILITOT 1.2  PROT 7.8  ALBUMIN 4.3   Recent Labs  Lab 01/21/21 0952  LIPASE 34   No results for input(s): AMMONIA in the last 168 hours. CBC: Recent Labs  Lab 01/21/21 0952 01/22/21 0221 01/23/21 0254 01/24/21 0612 01/26/21 0255  WBC 4.8 10.8* 7.2 5.5 5.1  NEUTROABS 3.4  --  5.0  --  2.8  HGB 13.9 12.7 11.8* 12.6 10.8*  HCT 42.1 38.6 36.6 37.6 32.6*  MCV 94.8 95.5 96.8 93.8 94.2  PLT 318 284 247 257 253    Cardiac Enzymes: No results for input(s): CKTOTAL, CKMB, CKMBINDEX, TROPONINI in the last 168 hours. BNP: Invalid input(s): POCBNP CBG: No results for input(s): GLUCAP in the last 168 hours. D-Dimer No results for input(s): DDIMER in the last 72 hours. Hgb A1c No results for input(s): HGBA1C in the last 72 hours. Lipid Profile No results for input(s): CHOL, HDL, LDLCALC, TRIG, CHOLHDL, LDLDIRECT in the last 72 hours. Thyroid function studies No results for input(s): TSH, T4TOTAL, T3FREE, THYROIDAB in the last 72 hours.  Invalid input(s): FREET3 Anemia work up No results for input(s): VITAMINB12, FOLATE, FERRITIN, TIBC, IRON, RETICCTPCT in the last 72 hours. Urinalysis    Component Value Date/Time   COLORURINE YELLOW 01/21/2021 1258   APPEARANCEUR CLEAR 01/21/2021 1258   LABSPEC 1.020 01/21/2021 1258   PHURINE 5.0 01/21/2021 1258   GLUCOSEU NEGATIVE 01/21/2021 1258   HGBUR NEGATIVE 01/21/2021 [place] 01/21/2021 [place] 01/21/2021 1258   PROTEINUR 30 (A) 01/21/2021 1258   NITRITE NEGATIVE 01/21/2021 1258   LEUKOCYTESUR NEGATIVE 01/21/2021 1258   Sepsis Labs Invalid input(s): PROCALCITONIN,  WBC,  LACTICIDVEN Microbiology Recent Results (from the past 240 hour(s))  Resp  Panel by RT-PCR (Flu A&B, Covid) Nasopharyngeal Swab     Status: None   Collection Time: 01/21/21  8:00 PM   Specimen: Nasopharyngeal Swab; Nasopharyngeal(NP) swabs in vial transport medium  Result Value Ref Range Status   SARS Coronavirus 2 by RT PCR NEGATIVE NEGATIVE Final    Comment: (NOTE) SARS-CoV-2 target nucleic acids are NOT DETECTED.  The SARS-CoV-2 RNA is generally detectable in upper respiratory specimens during the acute phase of infection. The lowest concentration of SARS-CoV-2 viral copies this assay can detect is 138 copies/mL. A negative result does not preclude SARS-Cov-2 infection and should not be used as the sole basis for treatment or other  patient management decisions. A negative result may occur with  improper specimen collection/handling, submission of specimen other than nasopharyngeal swab, presence of viral mutation(s) within the areas targeted by this assay, and inadequate number of viral copies(<138 copies/mL). A negative result must be combined with clinical observations, patient history, and epidemiological information. The expected result is Negative.  Fact Sheet for Patients:  EntrepreneurPulse.com.au  Fact Sheet for Healthcare Providers:  IncredibleEmployment.be  This test is no t yet approved or cleared by the [place] FDA and  has been authorized for detection and/or diagnosis of SARS-CoV-2 by FDA under an Emergency Use Authorization (EUA). This EUA will remain  in effect (meaning this test can be used) for the duration of the COVID-19 declaration under Section 564(b)(1) of the Act, 21 U.S.C.section 360bbb-3(b)(1), unless the authorization is terminated  or revoked sooner.       Influenza A by PCR NEGATIVE NEGATIVE Final   Influenza B by PCR NEGATIVE NEGATIVE Final    Comment: (NOTE) The Xpert Xpress SARS-CoV-2/FLU/RSV plus assay is intended as an aid in the diagnosis of influenza from Nasopharyngeal swab specimens and should not be used as a sole basis for treatment. Nasal washings and aspirates are unacceptable for Xpert Xpress SARS-CoV-2/FLU/RSV testing.  Fact Sheet for Patients: EntrepreneurPulse.com.au  Fact Sheet for Healthcare Providers: IncredibleEmployment.be  This test is not yet approved or cleared by the [place] FDA and has been authorized for detection and/or diagnosis of SARS-CoV-2 by FDA under an Emergency Use Authorization (EUA). This EUA will remain in effect (meaning this test can be used) for the duration of the COVID-19 declaration under Section 564(b)(1) of the Act, 21 U.S.C. section  360bbb-3(b)(1), unless the authorization is terminated or revoked.  Performed at [facility], [place] [address] [place], [place] [address]      Time coordinating discharge: Over 30 minutes  SIGNED:   Nolberto Hanlon, MD  [facility] 01/26/2021, 10:25 AM Pager   If 7PM-7AM, please contact night-coverage [email or web address] Password [email or web address]

## 2021-01-26 NOTE — Progress Notes (Signed)
Patient discharging home. Instructions given. All concerns addressed.

## 2021-08-23 DIAGNOSIS — F411 Generalized anxiety disorder: Secondary | ICD-10-CM | POA: Diagnosis not present

## 2021-08-23 DIAGNOSIS — G8929 Other chronic pain: Secondary | ICD-10-CM | POA: Diagnosis not present

## 2021-08-23 DIAGNOSIS — F5101 Primary insomnia: Secondary | ICD-10-CM | POA: Diagnosis not present

## 2021-08-23 DIAGNOSIS — E559 Vitamin D deficiency, unspecified: Secondary | ICD-10-CM | POA: Diagnosis not present

## 2021-09-05 DIAGNOSIS — M9903 Segmental and somatic dysfunction of lumbar region: Secondary | ICD-10-CM | POA: Diagnosis not present

## 2021-09-05 DIAGNOSIS — M50122 Cervical disc disorder at C5-C6 level with radiculopathy: Secondary | ICD-10-CM | POA: Diagnosis not present

## 2021-09-05 DIAGNOSIS — M4318 Spondylolisthesis, sacral and sacrococcygeal region: Secondary | ICD-10-CM | POA: Diagnosis not present

## 2021-09-05 DIAGNOSIS — M5136 Other intervertebral disc degeneration, lumbar region: Secondary | ICD-10-CM | POA: Diagnosis not present

## 2021-09-06 ENCOUNTER — Other Ambulatory Visit: Payer: Self-pay | Admitting: Unknown Physician Specialty

## 2021-11-14 ENCOUNTER — Ambulatory Visit: Payer: BC Managed Care – PPO | Admitting: Family Medicine

## 2021-11-23 DIAGNOSIS — M5459 Other low back pain: Secondary | ICD-10-CM | POA: Diagnosis not present

## 2021-11-23 DIAGNOSIS — F411 Generalized anxiety disorder: Secondary | ICD-10-CM | POA: Diagnosis not present

## 2021-11-23 DIAGNOSIS — J0100 Acute maxillary sinusitis, unspecified: Secondary | ICD-10-CM | POA: Diagnosis not present

## 2021-11-27 IMAGING — CT CT CHEST W/O CM
2 of 3 series · 15 of 36 positions shown, 18 images · non-contrast
Comparison: Abdominopelvic CT earlier today.

CLINICAL DATA: Esophageal perforation on CT abdomen/pelvis. Pain
after upper endoscopy and colonoscopy 1 day ago.

EXAM:
CT CHEST WITHOUT CONTRAST
TECHNIQUE: Multidetector CT imaging of the chest was performed following the
standard protocol without IV contrast.

[Series 2: thorax · axial · 0.58mm/px · z∈[+1354,+1596]mm · 12 of 143 slices shown, 15 images]
[im 11/143  mediastinal]
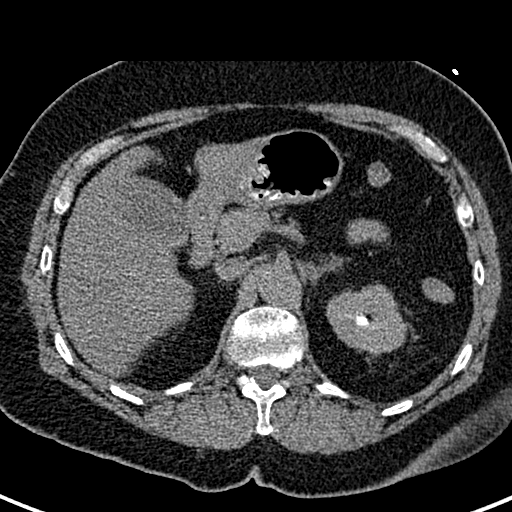
[im 11/143  lung]
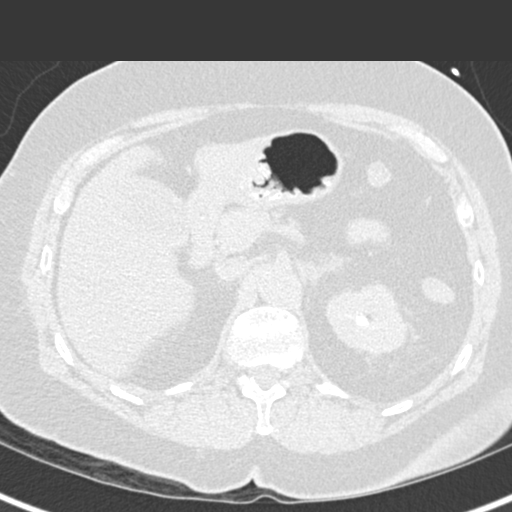
[im 22/143  lung]
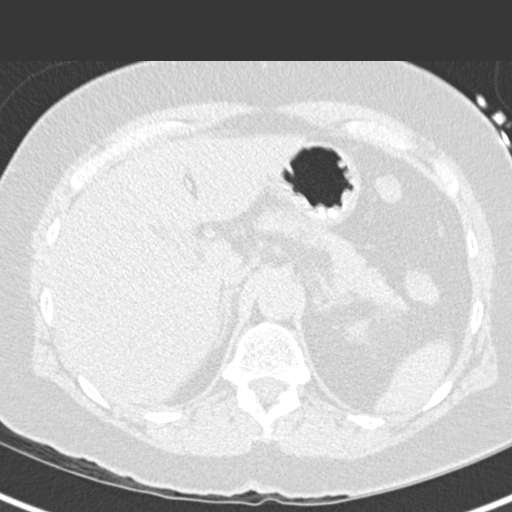
[im 32/143  lung]
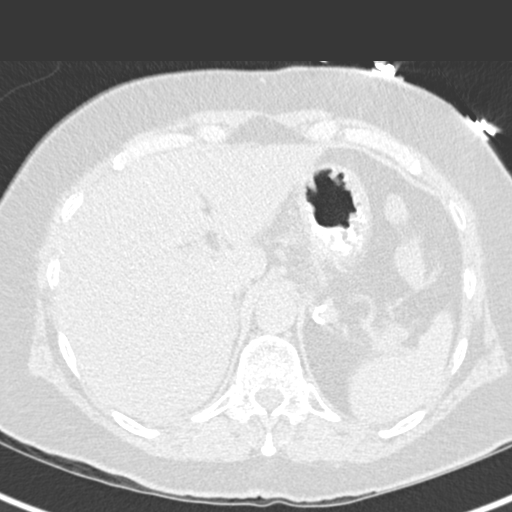
[im 43/143  lung]
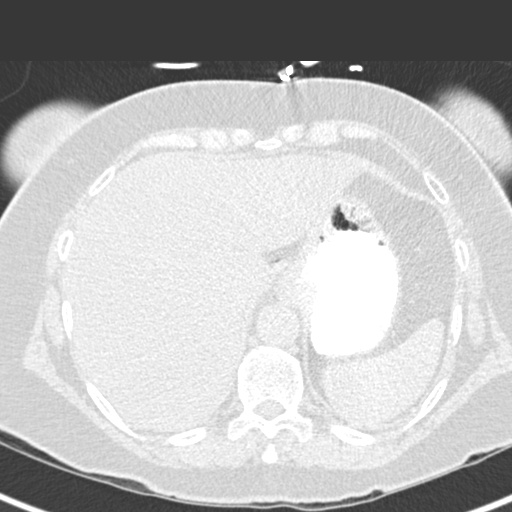
[im 53/143  mediastinal]
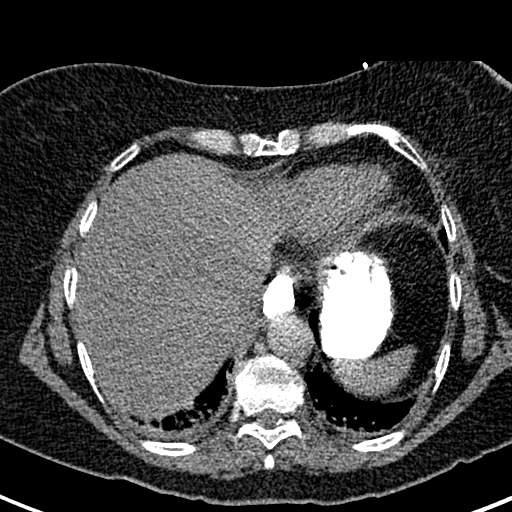
[im 53/143  lung]
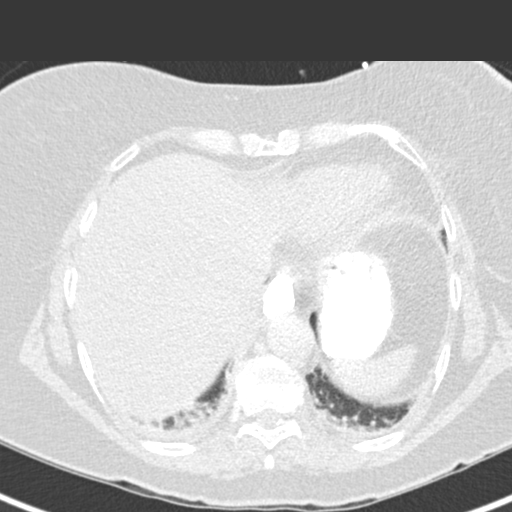
[im 64/143  lung]
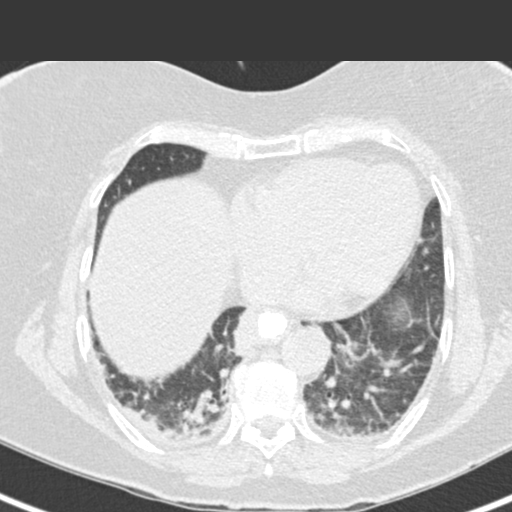
[im 79/143  lung]
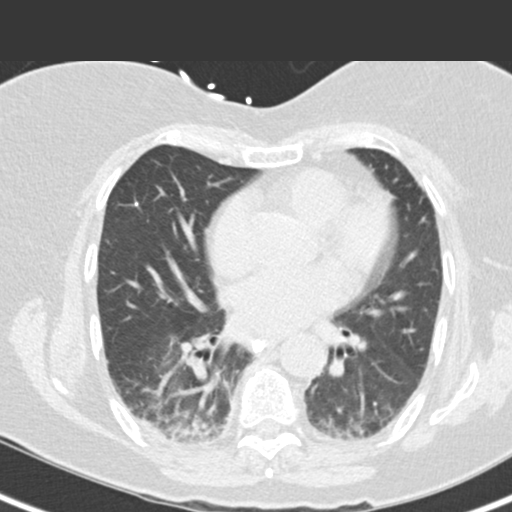
[im 90/143  lung]
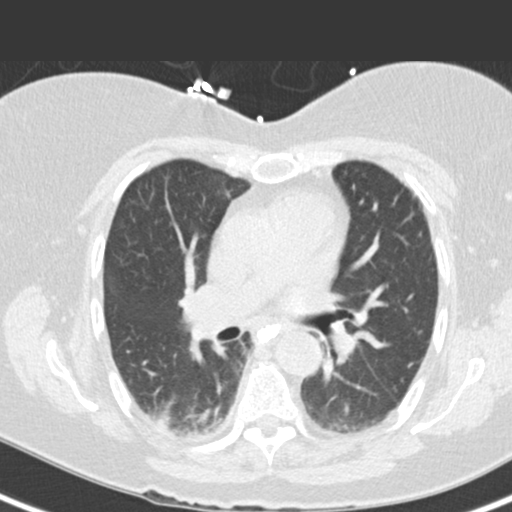
[im 100/143  mediastinal]
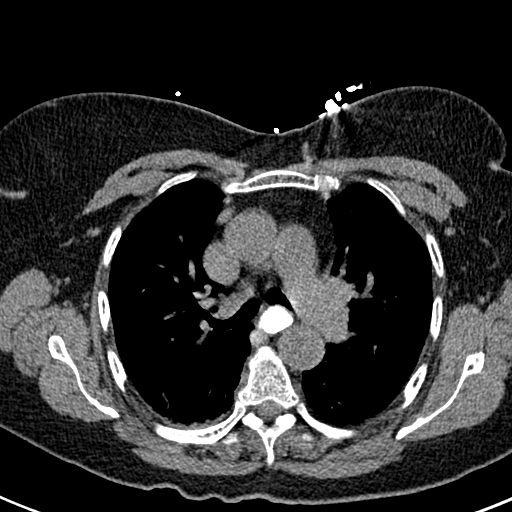
[im 100/143  lung]
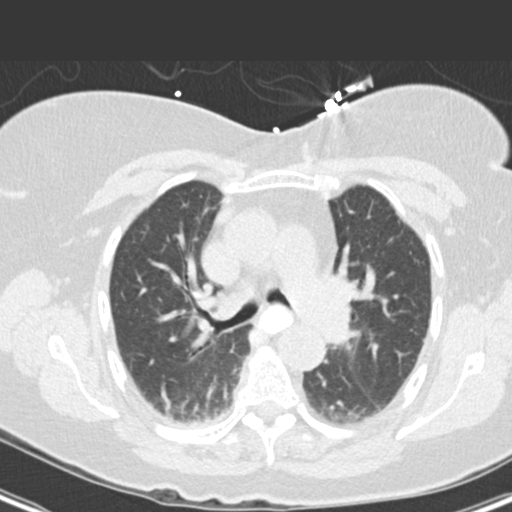
[im 111/143  lung]
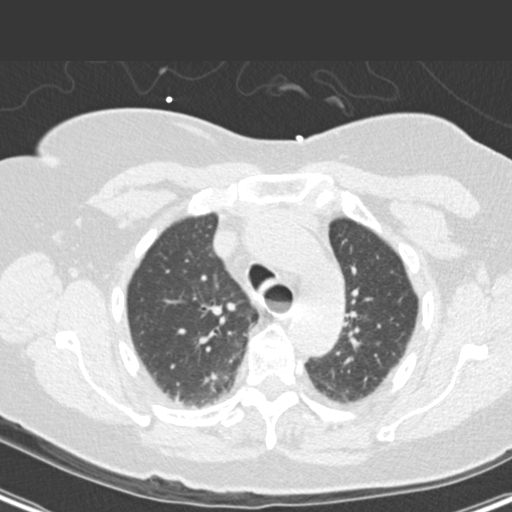
[im 121/143  lung]
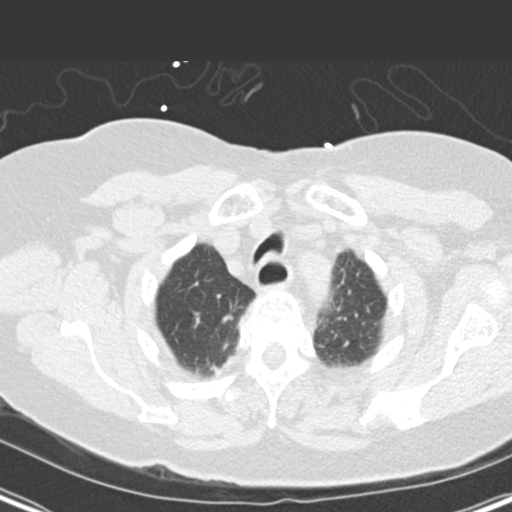
[im 132/143  lung]
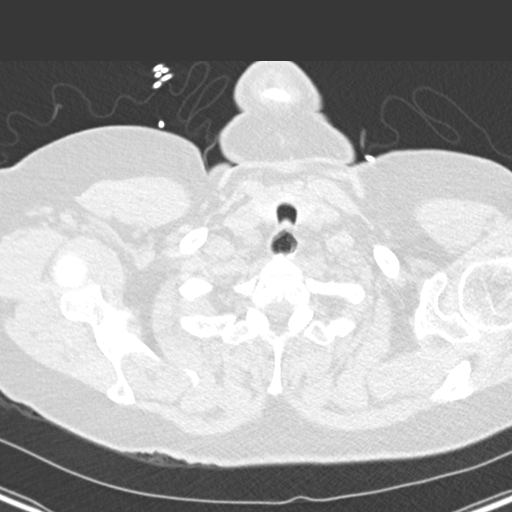

[Series 6: coronal · coronal · 0.59mm/px · 3 of 132 slices shown]
[im 27/132  lung]
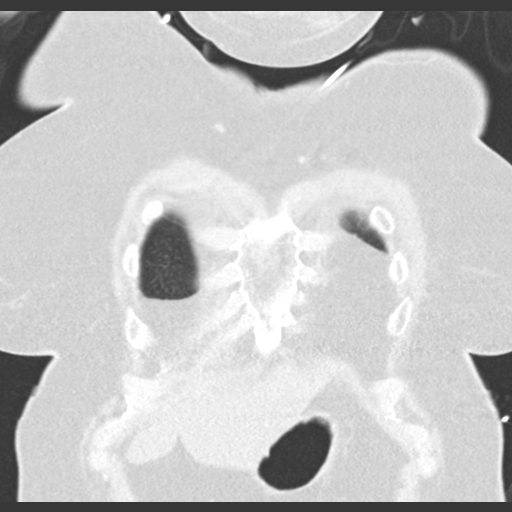
[im 53/132  lung]
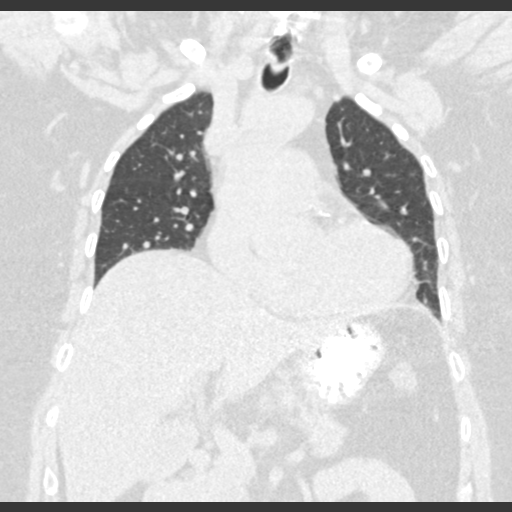
[im 79/132  lung]
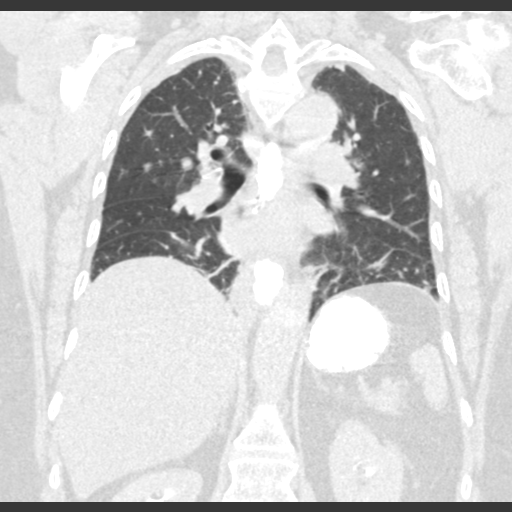

[15 of 36 positions shown; findings below may reference images not displayed]

FINDINGS: Cardiovascular: Normal heart size. Coronary artery calcifications.
No pericardial effusion. Atherosclerosis of the thoracic aorta. No
aortic aneurysm. Conventional branching pattern from the aortic
arch.

Mediastinum/Nodes: Administered enteric contrast opacifies the mid
and distal esophagus. There is no evidence of contrast
extravasation. Wall thickening of the distal esophagus with small
foci of pneumomediastinum at and just beyond the gastroesophageal
junction, series 5, images 94 and 100. The degree of
pneumomediastinum has improved from abdominal CT earlier today.
Small amount of fluid right posterolateral esophagus as seen on CT.
No contrast extends into the paraesophageal fluid. No bulky
mediastinal or obvious hilar adenopathy. No thyroid nodule.

Lungs/Pleura: No pneumothorax. Dependent atelectasis within both
lower lobes without confluent consolidation. Minimal right pleural
thickening, no significant pleural fluid. Trachea and central
bronchi are patent. No pulmonary edema. Calcified granuloma noted in
the right middle lobe.

Upper Abdomen: Assessed and abdominopelvic CT earlier today. Enteric
contrast in the stomach without evidence of gastric extravasation.
Excreted IV contrast within both renal collecting systems.

Musculoskeletal: There are no acute or suspicious osseous
abnormalities.
IMPRESSION: 1. No extravasation of enteric contrast from the esophagus. There is
wall thickening of the distal esophagus with small volume of
pneumomediastinum at and just beyond the gastroesophageal junction,
raising concern for esophageal perforation. The degree of
pneumomediastinum has improved from CT earlier today.
2. Dependent atelectasis in both lower lobes.
3. Aortic atherosclerosis.  Coronary artery calcifications.

Aortic Atherosclerosis ([id number]-3L3.3).

## 2021-12-19 DIAGNOSIS — M4726 Other spondylosis with radiculopathy, lumbar region: Secondary | ICD-10-CM | POA: Diagnosis not present

## 2022-01-07 DIAGNOSIS — M48061 Spinal stenosis, lumbar region without neurogenic claudication: Secondary | ICD-10-CM | POA: Diagnosis not present

## 2022-01-07 DIAGNOSIS — M4316 Spondylolisthesis, lumbar region: Secondary | ICD-10-CM | POA: Diagnosis not present

## 2022-01-07 DIAGNOSIS — M5116 Intervertebral disc disorders with radiculopathy, lumbar region: Secondary | ICD-10-CM | POA: Diagnosis not present

## 2022-01-07 DIAGNOSIS — M4726 Other spondylosis with radiculopathy, lumbar region: Secondary | ICD-10-CM | POA: Diagnosis not present

## 2022-01-16 DIAGNOSIS — M5416 Radiculopathy, lumbar region: Secondary | ICD-10-CM | POA: Diagnosis not present

## 2022-03-13 ENCOUNTER — Encounter: Payer: Self-pay | Admitting: Internal Medicine

## 2022-03-13 ENCOUNTER — Ambulatory Visit: Payer: BC Managed Care – PPO | Admitting: Internal Medicine

## 2022-03-13 VITALS — BP 116/72 | HR 71 | Temp 97.9°F | Ht 62.0 in | Wt 170.0 lb

## 2022-03-13 DIAGNOSIS — G2581 Restless legs syndrome: Secondary | ICD-10-CM

## 2022-03-13 DIAGNOSIS — F5104 Psychophysiologic insomnia: Secondary | ICD-10-CM

## 2022-03-13 DIAGNOSIS — Z0001 Encounter for general adult medical examination with abnormal findings: Secondary | ICD-10-CM

## 2022-03-13 DIAGNOSIS — I10 Essential (primary) hypertension: Secondary | ICD-10-CM

## 2022-03-13 DIAGNOSIS — Z0000 Encounter for general adult medical examination without abnormal findings: Secondary | ICD-10-CM | POA: Diagnosis not present

## 2022-03-13 DIAGNOSIS — K7581 Nonalcoholic steatohepatitis (NASH): Secondary | ICD-10-CM

## 2022-03-13 DIAGNOSIS — Z23 Encounter for immunization: Secondary | ICD-10-CM | POA: Diagnosis not present

## 2022-03-13 DIAGNOSIS — F411 Generalized anxiety disorder: Secondary | ICD-10-CM

## 2022-03-13 DIAGNOSIS — Z720 Tobacco use: Secondary | ICD-10-CM

## 2022-03-13 DIAGNOSIS — R1319 Other dysphagia: Secondary | ICD-10-CM

## 2022-03-13 DIAGNOSIS — K219 Gastro-esophageal reflux disease without esophagitis: Secondary | ICD-10-CM

## 2022-03-13 DIAGNOSIS — M48061 Spinal stenosis, lumbar region without neurogenic claudication: Secondary | ICD-10-CM

## 2022-03-13 DIAGNOSIS — Z1231 Encounter for screening mammogram for malignant neoplasm of breast: Secondary | ICD-10-CM | POA: Insufficient documentation

## 2022-03-13 DIAGNOSIS — M160 Bilateral primary osteoarthritis of hip: Secondary | ICD-10-CM

## 2022-03-13 LAB — CBC WITH DIFFERENTIAL/PLATELET
Basophils Absolute: 0 10*3/uL (ref 0.0–0.1)
Basophils Relative: 0.8 % (ref 0.0–3.0)
Eosinophils Absolute: 0.3 10*3/uL (ref 0.0–0.7)
Eosinophils Relative: 6.6 % — ABNORMAL HIGH (ref 0.0–5.0)
HCT: 41.1 % (ref 36.0–46.0)
Hemoglobin: 13.5 g/dL (ref 12.0–15.0)
Lymphocytes Relative: 29.9 % (ref 12.0–46.0)
Lymphs Abs: 1.3 10*3/uL (ref 0.7–4.0)
MCHC: 33 g/dL (ref 30.0–36.0)
MCV: 94.7 fl (ref 78.0–100.0)
Monocytes Absolute: 0.4 10*3/uL (ref 0.1–1.0)
Monocytes Relative: 9.8 % (ref 3.0–12.0)
Neutro Abs: 2.2 10*3/uL (ref 1.4–7.7)
Neutrophils Relative %: 52.9 % (ref 43.0–77.0)
Platelets: 280 10*3/uL (ref 150.0–400.0)
RBC: 4.33 Mil/uL (ref 3.87–5.11)
RDW: 13.5 % (ref 11.5–15.5)
WBC: 4.2 10*3/uL (ref 4.0–10.5)

## 2022-03-13 LAB — LIPID PANEL
Cholesterol: 212 mg/dL — ABNORMAL HIGH (ref 0–200)
HDL: 59 mg/dL (ref >39.00)
LDL Cholesterol: 122 mg/dL — ABNORMAL HIGH (ref 0–99)
NonHDL: 152.54
Total CHOL/HDL Ratio: 4
Triglycerides: 154 mg/dL — ABNORMAL HIGH (ref 0.0–149.0)
VLDL: 30.8 mg/dL (ref 0.0–40.0)

## 2022-03-13 LAB — HEPATIC FUNCTION PANEL
ALT: 15 U/L (ref 0–35)
AST: 17 U/L (ref 0–37)
Albumin: 4.4 g/dL (ref 3.5–5.2)
Alkaline Phosphatase: 92 U/L (ref 39–117)
Bilirubin, Direct: 0.1 mg/dL (ref 0.0–0.3)
Total Bilirubin: 0.8 mg/dL (ref 0.2–1.2)
Total Protein: 7.3 g/dL (ref 6.0–8.3)

## 2022-03-13 LAB — BASIC METABOLIC PANEL
BUN: 18 mg/dL (ref 6–23)
CO2: 28 mEq/L (ref 19–32)
Calcium: 9.6 mg/dL (ref 8.4–10.5)
Chloride: 105 mEq/L (ref 96–112)
Creatinine, Ser: 0.82 mg/dL (ref 0.40–1.20)
GFR: 75.56 mL/min (ref >60.00)
Glucose, Bld: 104 mg/dL — ABNORMAL HIGH (ref 70–99)
Potassium: 4.9 mEq/L (ref 3.5–5.1)
Sodium: 141 mEq/L (ref 135–145)

## 2022-03-13 LAB — TSH: TSH: 1.54 u[IU]/mL (ref 0.35–5.50)

## 2022-03-13 LAB — IBC + FERRITIN
Ferritin: 71.4 ng/mL (ref 10.0–291.0)
Iron: 110 ug/dL (ref 42–145)
Saturation Ratios: 29.8 % (ref 20.0–50.0)
TIBC: 369.6 ug/dL (ref 250.0–450.0)
Transferrin: 264 mg/dL (ref 212.0–360.0)

## 2022-03-13 MED ORDER — TRAMADOL HCL ER 100 MG PO TB24: 100.0000 mg | ORAL_TABLET | Freq: Every day | ORAL | 1 refills | Status: DC | PRN

## 2022-03-13 MED ORDER — LOSARTAN POTASSIUM 50 MG PO TABS: 50.0000 mg | ORAL_TABLET | Freq: Every day | ORAL | 1 refills | Status: DC

## 2022-03-13 MED ORDER — FAMOTIDINE 20 MG PO TABS: 20.0000 mg | ORAL_TABLET | Freq: Every day | ORAL | 1 refills | Status: DC

## 2022-03-13 NOTE — Progress Notes (Unsigned)
Subjective:  Patient ID: Cathy Goodwin, female    DOB: 08/24/1956  Age: 66 y.o. MRN: [id number]  CC: Annual Exam, Hypertension, and Hyperlipidemia   HPI Cathy Goodwin presents for a CPX and to establish.  She is active and denies CP, DOE, edema, fatigue.  History Cathy Goodwin has a past medical history of GERD (gastroesophageal reflux disease) and Hypertension.   She has a past surgical history that includes Wisdom tooth extraction.   Her family history includes Cancer in her father; Heart disease in her father and mother; Hypertension in her sister.She reports that she has been smoking cigarettes. She has a 10.00 pack-year smoking history. She has never used smokeless tobacco. She reports current alcohol use of about 1.0 standard drink of alcohol per week. She reports that she does not use drugs.  Outpatient Medications Prior to Visit  Medication Sig Dispense Refill   acetaminophen (TYLENOL) 500 MG tablet Take 500 mg by mouth every 6 (six) hours as needed for moderate pain.     ALPRAZolam (XANAX) 0.5 MG tablet Take 0.5 mg by mouth at bedtime as needed for anxiety or sleep.     azelastine (ASTELIN) 0.1 % nasal spray Place 2 sprays into both nostrils daily as needed for allergies.     fluticasone (FLONASE) 50 MCG/ACT nasal spray Place 1 spray into both nostrils 2 (two) times daily. (Patient taking differently: Place 1 spray into both nostrils daily.) 16 g 2   montelukast (SINGULAIR) 10 MG tablet Take 1 tablet by mouth daily as needed (allergies).     Vitamin D, Ergocalciferol, (DRISDOL) 1.25 MG (50000 UNIT) CAPS capsule Take 1 capsule by mouth once a week.     zolpidem (AMBIEN) 10 MG tablet Take 5-10 mg by mouth at bedtime as needed for sleep.     famotidine (PEPCID) 20 MG tablet Take 20 mg by mouth at bedtime.     omeprazole (PRILOSEC) 40 MG capsule Take 1 capsule by mouth 2 (two) times daily as needed (indigestion/acid reflux).     No facility-administered medications prior to visit.     ROS Review of Systems  Constitutional:  Positive for unexpected weight change (wt gain). Negative for chills, diaphoresis and fatigue.  HENT:  Positive for trouble swallowing. Negative for voice change.   Eyes: Negative.   Respiratory:  Negative for cough, chest tightness, shortness of breath and wheezing.   Cardiovascular:  Negative for chest pain, palpitations and leg swelling.  Gastrointestinal:  Negative for abdominal pain, constipation, diarrhea, nausea and vomiting.  Endocrine: Negative.   Genitourinary: Negative.  Negative for difficulty urinating.  Musculoskeletal:  Positive for arthralgias and back pain. Negative for joint swelling and myalgias.  Skin: Negative.  Negative for color change and rash.  Neurological: Negative.  Negative for dizziness, weakness and light-headedness.  Hematological:  Negative for adenopathy. Does not bruise/bleed easily.  Psychiatric/Behavioral:  Positive for sleep disturbance (RLS). Negative for decreased concentration, dysphoric mood, self-injury and suicidal ideas. The patient is nervous/anxious.     Objective:  BP 116/72 (BP Location: Left Arm, Patient Position: Sitting, Cuff Size: Large)   Pulse 71   Temp 97.9 F (36.6 C) (Oral)   Ht 5' 2"  (1.575 m)   Wt 170 lb (77.1 kg)   SpO2 96%   BMI 31.09 kg/m   Physical Exam Vitals reviewed.  Constitutional:      Appearance: She is not ill-appearing.  HENT:     Nose: Nose normal.     Mouth/Throat:     Mouth: Mucous  membranes are moist.  Eyes:     General: No scleral icterus.    Conjunctiva/sclera: Conjunctivae normal.  Cardiovascular:     Rate and Rhythm: Normal rate and regular rhythm.     Heart sounds: No murmur heard. Pulmonary:     Effort: Pulmonary effort is normal.     Breath sounds: No stridor. No wheezing, rhonchi or rales.  Abdominal:     General: Abdomen is flat.     Palpations: There is no mass.     Tenderness: There is no abdominal tenderness. There is no guarding.      Hernia: No hernia is present.  Musculoskeletal:        General: Normal range of motion.     Cervical back: Neck supple.     Right lower leg: No edema.     Left lower leg: No edema.  Lymphadenopathy:     Cervical: No cervical adenopathy.  Skin:    General: Skin is warm and dry.     Coloration: Skin is not pale.  Neurological:     General: No focal deficit present.     Mental Status: She is alert. Mental status is at baseline.  Psychiatric:        Mood and Affect: Mood normal.        Behavior: Behavior normal.     Lab Results  Component Value Date   WBC 4.2 03/13/2022   HGB 13.5 03/13/2022   HCT 41.1 03/13/2022   PLT 280.0 03/13/2022   GLUCOSE 104 (H) 03/13/2022   CHOL 212 (H) 03/13/2022   TRIG 154.0 (H) 03/13/2022   HDL 59.00 03/13/2022   LDLCALC 122 (H) 03/13/2022   ALT 15 03/13/2022   AST 17 03/13/2022   NA 141 03/13/2022   K 4.9 03/13/2022   CL 105 03/13/2022   CREATININE 0.82 03/13/2022   BUN 18 03/13/2022   CO2 28 03/13/2022   TSH 1.54 03/13/2022     Assessment & Plan:   Cathy Goodwin was seen today for annual exam, hypertension and hyperlipidemia.  Diagnoses and all orders for this visit:  Gastroesophageal reflux disease without esophagitis- Will cont the H2 blocker. -     CBC with Differential/Platelet; Future -     Basic metabolic panel; Future -     famotidine (PEPCID) 20 MG tablet; Take 1 tablet (20 mg total) by mouth at bedtime. -     Basic metabolic panel -     CBC with Differential/Platelet  Encounter for general adult medical examination with abnormal findings- Exam completed, labs reviewed - statin tx is not indicated, cancer screenings addressed, vaccines updated, pt ed material was given. -     Lipid panel; Future -     HIV Antibody (routine testing w rflx); Future -     HIV Antibody (routine testing w rflx) -     Lipid panel  Restless leg -     IBC + Ferritin; Future -     CBC with Differential/Platelet; Future -     traMADol (ULTRAM-ER) 100 MG  24 hr tablet; Take 1 tablet (100 mg total) by mouth daily as needed for pain. -     CBC with Differential/Platelet -     IBC + Ferritin  NASH (nonalcoholic steatohepatitis)- LFT's are normal now. -     Hepatic function panel; Future -     Hepatic function panel  GAD (generalized anxiety disorder) -     TSH; Future -     TSH  Psychophysiological insomnia -  TSH; Future -     TSH  Esophageal dysphagia -     Ambulatory referral to Gastroenterology  Spinal stenosis of lumbar region without neurogenic claudication -     traMADol (ULTRAM-ER) 100 MG 24 hr tablet; Take 1 tablet (100 mg total) by mouth daily as needed for pain.  Primary osteoarthritis of both hips -     traMADol (ULTRAM-ER) 100 MG 24 hr tablet; Take 1 tablet (100 mg total) by mouth daily as needed for pain.  Visit for screening mammogram -     MM DIGITAL SCREENING BILATERAL; Future  Primary hypertension -     losartan (COZAAR) 50 MG tablet; Take 1 tablet (50 mg total) by mouth daily.  Continuous tobacco abuse  Other orders -     Varicella-zoster vaccine IM (Shingrix)   I have discontinued Makenna Billig's omeprazole. I have also changed her famotidine. Additionally, I am having her start on traMADol and losartan. Lastly, I am having her maintain her ALPRAZolam, zolpidem, fluticasone, montelukast, Vitamin D (Ergocalciferol), azelastine, and acetaminophen.  Meds ordered this encounter  Medications   traMADol (ULTRAM-ER) 100 MG 24 hr tablet    Sig: Take 1 tablet (100 mg total) by mouth daily as needed for pain.    Dispense:  90 tablet    Refill:  1   famotidine (PEPCID) 20 MG tablet    Sig: Take 1 tablet (20 mg total) by mouth at bedtime.    Dispense:  90 tablet    Refill:  1   losartan (COZAAR) 50 MG tablet    Sig: Take 1 tablet (50 mg total) by mouth daily.    Dispense:  90 tablet    Refill:  1     Follow-up: Return in about 6 months (around 09/12/2022).  Scarlette Calico, MD

## 2022-03-13 NOTE — Patient Instructions (Signed)

## 2022-03-14 LAB — HIV ANTIBODY (ROUTINE TESTING W REFLEX): HIV 1&2 Ab, 4th Generation: NONREACTIVE

## 2022-03-30 ENCOUNTER — Ambulatory Visit
Admission: RE | Admit: 2022-03-30 | Discharge: 2022-03-30 | Disposition: A | Discharge status: Home / Self Care | Payer: BC Managed Care – PPO | Source: Ambulatory Visit | Attending: Internal Medicine | Admitting: Internal Medicine

## 2022-03-30 DIAGNOSIS — Z1231 Encounter for screening mammogram for malignant neoplasm of breast: Secondary | ICD-10-CM

## 2022-04-03 ENCOUNTER — Ambulatory Visit: Payer: BC Managed Care – PPO | Admitting: Internal Medicine

## 2022-04-24 ENCOUNTER — Other Ambulatory Visit: Payer: Self-pay | Admitting: Internal Medicine

## 2022-04-25 ENCOUNTER — Other Ambulatory Visit: Payer: Self-pay | Admitting: Internal Medicine

## 2022-04-25 DIAGNOSIS — F5104 Psychophysiologic insomnia: Secondary | ICD-10-CM

## 2022-04-25 DIAGNOSIS — F411 Generalized anxiety disorder: Secondary | ICD-10-CM

## 2022-04-25 DIAGNOSIS — G2581 Restless legs syndrome: Secondary | ICD-10-CM

## 2022-04-25 MED ORDER — ZOLPIDEM TARTRATE 10 MG PO TABS: 5.0000 mg | ORAL_TABLET | Freq: Every evening | ORAL | 2 refills | Status: DC | PRN

## 2022-04-25 MED ORDER — ALPRAZOLAM 0.5 MG PO TABS: 0.5000 mg | ORAL_TABLET | Freq: Every evening | ORAL | 2 refills | Status: DC | PRN

## 2022-06-07 ENCOUNTER — Telehealth: Payer: Self-pay | Admitting: Gastroenterology

## 2022-06-07 NOTE — Telephone Encounter (Signed)
Good Afternoon Dr Havery Moros,  Supervising MD 03/13/22 AM.  We have received a referral from patient primary care for patient to be seen for Esophageal Dysphagia.  Patient was last seen with [facility] in 2022. Patient states she previously had her last procedures with them and ended up in the hospital for 6 days due to a rupture in her esophagus. Patient states her husband comes to our practice and would like to establish care here at [facility].  Records are available for review in epics. Please review and advise on scheduling.  Thank you

## 2022-06-08 NOTE — Telephone Encounter (Signed)
Happy to see in the office for routine visit

## 2022-06-24 ENCOUNTER — Other Ambulatory Visit: Payer: Self-pay | Admitting: Internal Medicine

## 2022-06-26 MED ORDER — ACETAMINOPHEN 500 MG PO TABS
500.0000 mg | ORAL_TABLET | Freq: Four times a day (QID) | ORAL | 1 refills | Status: DC | PRN
Start: 2022-06-26 — End: 2024-05-19

## 2022-06-26 MED ORDER — AZELASTINE HCL 0.1 % NA SOLN: 2.0000 | Freq: Every day | NASAL | 1 refills | Status: AC | PRN

## 2022-07-17 ENCOUNTER — Ambulatory Visit (INDEPENDENT_AMBULATORY_CARE_PROVIDER_SITE_OTHER): Payer: BC Managed Care – PPO | Admitting: *Deleted

## 2022-07-17 ENCOUNTER — Telehealth: Payer: Self-pay | Admitting: Internal Medicine

## 2022-07-17 DIAGNOSIS — Z23 Encounter for immunization: Secondary | ICD-10-CM

## 2022-07-17 NOTE — Progress Notes (Signed)
Administered 2nd Shingles injection right deltoid. Pt tolerated well.

## 2022-07-17 NOTE — Telephone Encounter (Signed)
PT visits today and is requesting clarification on one of their prescriptions. PT was prescribed Losartan Potassium (COZAAR) 50 MG during their last visit. PT stated she was originally taking the Losartan Hydrochlorothiazide (HYZAAR) 50 MG and had informed me that the pharmacy would not be filling the Losartan Potassium (COZAAR) for her. PT has plenty of their old medication but wanted to make sure that we get the correct Losartan on the chart (Unless it was supposed to be changed and PT had forgotten it was changed).  CB: [phone]

## 2022-07-19 NOTE — Telephone Encounter (Signed)
LVM to discuss

## 2022-07-20 NOTE — Telephone Encounter (Signed)
Pt has been informed and expressed understanding.  

## 2022-08-08 ENCOUNTER — Encounter: Payer: Self-pay | Admitting: Gastroenterology

## 2022-09-05 ENCOUNTER — Ambulatory Visit: Payer: BC Managed Care – PPO | Admitting: Gastroenterology

## 2022-09-05 DIAGNOSIS — R109 Unspecified abdominal pain: Secondary | ICD-10-CM | POA: Diagnosis not present

## 2022-09-05 DIAGNOSIS — I10 Essential (primary) hypertension: Secondary | ICD-10-CM | POA: Diagnosis not present

## 2022-09-05 DIAGNOSIS — J0140 Acute pansinusitis, unspecified: Secondary | ICD-10-CM | POA: Diagnosis not present

## 2022-09-14 ENCOUNTER — Encounter: Payer: Self-pay | Admitting: Internal Medicine

## 2022-09-14 ENCOUNTER — Ambulatory Visit: Payer: BC Managed Care – PPO | Admitting: Internal Medicine

## 2022-09-14 VITALS — BP 126/82 | HR 72 | Temp 98.0°F | Ht 62.0 in | Wt 170.0 lb

## 2022-09-14 DIAGNOSIS — J0100 Acute maxillary sinusitis, unspecified: Secondary | ICD-10-CM | POA: Diagnosis not present

## 2022-09-14 MED ORDER — AMOXICILLIN-POT CLAVULANATE 875-125 MG PO TABS: 1.0000 | ORAL_TABLET | Freq: Two times a day (BID) | ORAL | 0 refills | Status: AC

## 2022-09-14 MED ORDER — HYDROCODONE BIT-HOMATROP MBR 5-1.5 MG/5ML PO SOLN: 5.0000 mL | Freq: Three times a day (TID) | ORAL | 0 refills | Status: AC | PRN

## 2022-09-14 NOTE — Progress Notes (Signed)
Subjective:  Patient ID: Cathy Goodwin, female    DOB: 11/19/1955  Age: 66 y.o. MRN: [id number]  CC: URI   HPI Cathy Goodwin presents for f/up -  She complains of a 1 month history of worsening URI symptoms.  She has had nasal phlegm that over the past few days has become thick and brown.  She has a nonproductive cough and denies fever, chills, or night sweats.  Outpatient Medications Prior to Visit  Medication Sig Dispense Refill   acetaminophen (TYLENOL) 500 MG tablet Take 1 tablet (500 mg total) by mouth every 6 (six) hours as needed for moderate pain. 30 tablet 1   ALPRAZolam (XANAX) 0.5 MG tablet Take 1 tablet (0.5 mg total) by mouth at bedtime as needed for anxiety or sleep. 30 tablet 2   azelastine (ASTELIN) 0.1 % nasal spray Place 2 sprays into both nostrils daily as needed for allergies. 90 mL 1   famotidine (PEPCID) 20 MG tablet Take 1 tablet (20 mg total) by mouth at bedtime. 90 tablet 1   fluticasone (FLONASE) 50 MCG/ACT nasal spray Place 1 spray into both nostrils 2 (two) times daily. (Patient taking differently: Place 1 spray into both nostrils daily.) 16 g 2   losartan (COZAAR) 50 MG tablet Take 1 tablet (50 mg total) by mouth daily. 90 tablet 1   montelukast (SINGULAIR) 10 MG tablet Take 1 tablet by mouth daily as needed (allergies).     traMADol (ULTRAM-ER) 100 MG 24 hr tablet Take 1 tablet (100 mg total) by mouth daily as needed for pain. 90 tablet 1   Vitamin D, Ergocalciferol, (DRISDOL) 1.25 MG (50000 UNIT) CAPS capsule Take 1 capsule by mouth once a week.     zolpidem (AMBIEN) 10 MG tablet Take 0.5-1 tablets (5-10 mg total) by mouth at bedtime as needed for sleep. 30 tablet 2   No facility-administered medications prior to visit.    ROS Review of Systems  Constitutional: Negative.  Negative for chills, diaphoresis, fatigue and fever.  HENT:  Positive for congestion, ear pain, postnasal drip, rhinorrhea, sinus pressure and sinus pain. Negative for facial  swelling and sore throat.   Eyes: Negative.   Respiratory:  Positive for cough. Negative for shortness of breath and wheezing.   Cardiovascular:  Negative for chest pain, palpitations and leg swelling.  Gastrointestinal:  Negative for abdominal pain, nausea and vomiting.  Genitourinary: Negative.   Musculoskeletal: Negative.   Skin: Negative.   Neurological: Negative.  Negative for dizziness.  Hematological:  Negative for adenopathy. Does not bruise/bleed easily.  Psychiatric/Behavioral: Negative.      Objective:  BP 126/82 (BP Location: Left Arm, Patient Position: Sitting, Cuff Size: Large)   Pulse 72   Temp 98 F (36.7 C) (Oral)   Ht 5' 2"  (1.575 m)   Wt 170 lb (77.1 kg)   SpO2 98%   BMI 31.09 kg/m   BP Readings from Last 3 Encounters:  09/14/22 126/82  03/13/22 116/72  01/26/21 123/70    Wt Readings from Last 3 Encounters:  09/14/22 170 lb (77.1 kg)  03/13/22 170 lb (77.1 kg)  01/21/21 175 lb (79.4 kg)    Physical Exam Vitals reviewed.  Constitutional:      Appearance: She is not ill-appearing.  HENT:     Right Ear: Hearing, tympanic membrane, ear canal and external ear normal. No middle ear effusion.     Left Ear: Hearing, tympanic membrane, ear canal and external ear normal.  No middle ear effusion.  Nose: Rhinorrhea present. No mucosal edema. Rhinorrhea is purulent.     Right Sinus: Maxillary sinus tenderness present. No frontal sinus tenderness.     Left Sinus: Maxillary sinus tenderness present. No frontal sinus tenderness.  Eyes:     General: No scleral icterus.    Conjunctiva/sclera: Conjunctivae normal.  Cardiovascular:     Rate and Rhythm: Normal rate and regular rhythm.     Heart sounds: No murmur heard. Pulmonary:     Effort: Pulmonary effort is normal.     Breath sounds: No stridor. No wheezing, rhonchi or rales.  Abdominal:     General: Abdomen is flat.     Palpations: There is no mass.     Tenderness: There is no abdominal tenderness.  There is no guarding.     Hernia: No hernia is present.  Musculoskeletal:        General: Normal range of motion.     Right lower leg: No edema.     Left lower leg: No edema.  Skin:    General: Skin is warm and dry.  Neurological:     General: No focal deficit present.  Psychiatric:        Mood and Affect: Mood normal.        Behavior: Behavior normal.     Lab Results  Component Value Date   WBC 4.2 03/13/2022   HGB 13.5 03/13/2022   HCT 41.1 03/13/2022   PLT 280.0 03/13/2022   GLUCOSE 104 (H) 03/13/2022   CHOL 212 (H) 03/13/2022   TRIG 154.0 (H) 03/13/2022   HDL 59.00 03/13/2022   LDLCALC 122 (H) 03/13/2022   ALT 15 03/13/2022   AST 17 03/13/2022   NA 141 03/13/2022   K 4.9 03/13/2022   CL 105 03/13/2022   CREATININE 0.82 03/13/2022   BUN 18 03/13/2022   CO2 28 03/13/2022   TSH 1.54 03/13/2022    MM 3D SCREEN BREAST BILATERAL  Result Date: 04/03/2022 CLINICAL DATA:  Screening. EXAM: DIGITAL SCREENING BILATERAL MAMMOGRAM WITH TOMOSYNTHESIS AND CAD TECHNIQUE: Bilateral screening digital craniocaudal and mediolateral oblique mammograms were obtained. Bilateral screening digital breast tomosynthesis was performed. The images were evaluated with computer-aided detection. COMPARISON:  Previous exam(s). ACR Breast Density Category b: There are scattered areas of fibroglandular density. FINDINGS: There are no findings suspicious for malignancy. IMPRESSION: No mammographic evidence of malignancy. A result letter of this screening mammogram will be mailed directly to the patient. RECOMMENDATION: Screening mammogram in one year. (Code:SM-B-01Y) BI-RADS CATEGORY  1: Negative. Electronically Signed   By: Nolon Nations M.D.   On: 04/03/2022 09:57   Assessment & Plan:   Andreah was seen today for uri.  Diagnoses and all orders for this visit:  Acute non-recurrent maxillary sinusitis- Will treat with Augmentin and will offer symptom relief. -     amoxicillin-clavulanate (AUGMENTIN)  875-125 MG tablet; Take 1 tablet by mouth 2 (two) times daily for 10 days. -     HYDROcodone bit-homatropine (HYCODAN) 5-1.5 MG/5ML syrup; Take 5 mLs by mouth every 8 (eight) hours as needed for up to 10 days for cough.   I am having Vedia Coffer start on amoxicillin-clavulanate and HYDROcodone bit-homatropine. I am also having her maintain her fluticasone, montelukast, Vitamin D (Ergocalciferol), traMADol, famotidine, losartan, zolpidem, ALPRAZolam, azelastine, and acetaminophen.  Meds ordered this encounter  Medications   amoxicillin-clavulanate (AUGMENTIN) 875-125 MG tablet    Sig: Take 1 tablet by mouth 2 (two) times daily for 10 days.    Dispense:  20  tablet    Refill:  0   HYDROcodone bit-homatropine (HYCODAN) 5-1.5 MG/5ML syrup    Sig: Take 5 mLs by mouth every 8 (eight) hours as needed for up to 10 days for cough.    Dispense:  120 mL    Refill:  0     Follow-up: Return if symptoms worsen or fail to improve.  Scarlette Calico, MD

## 2022-09-14 NOTE — Patient Instructions (Signed)

## 2022-09-22 ENCOUNTER — Other Ambulatory Visit: Payer: Self-pay | Admitting: Internal Medicine

## 2022-09-22 DIAGNOSIS — F5104 Psychophysiologic insomnia: Secondary | ICD-10-CM

## 2022-09-22 DIAGNOSIS — G2581 Restless legs syndrome: Secondary | ICD-10-CM

## 2022-09-22 NOTE — Telephone Encounter (Signed)
Patient called about this and said her meds were supposed to be sent in at her most recent appointment. She asked for these meds to please be sent in ALPRAZolam (XANAX) 0.5 MG tablet  zolpidem (AMBIEN) 10 MG tablet  Vitamin D, Ergocalciferol, (DRISDOL) 1.25 MG (50000 UNIT) CAPS capsule

## 2022-10-05 ENCOUNTER — Other Ambulatory Visit: Payer: Self-pay | Admitting: Internal Medicine

## 2022-10-05 ENCOUNTER — Telehealth: Payer: Self-pay | Admitting: Internal Medicine

## 2022-10-05 DIAGNOSIS — F411 Generalized anxiety disorder: Secondary | ICD-10-CM

## 2022-10-05 DIAGNOSIS — G2581 Restless legs syndrome: Secondary | ICD-10-CM

## 2022-10-05 DIAGNOSIS — E559 Vitamin D deficiency, unspecified: Secondary | ICD-10-CM

## 2022-10-05 DIAGNOSIS — F5104 Psychophysiologic insomnia: Secondary | ICD-10-CM

## 2022-10-05 DIAGNOSIS — I10 Essential (primary) hypertension: Secondary | ICD-10-CM

## 2022-10-05 MED ORDER — VITAMIN D (ERGOCALCIFEROL) 1.25 MG (50000 UNIT) PO CAPS: 50000.0000 [IU] | ORAL_CAPSULE | ORAL | 0 refills | Status: DC

## 2022-10-05 MED ORDER — LOSARTAN POTASSIUM 50 MG PO TABS: 50.0000 mg | ORAL_TABLET | Freq: Every day | ORAL | 0 refills | Status: DC

## 2022-10-05 MED ORDER — ALPRAZOLAM 0.5 MG PO TABS: 0.5000 mg | ORAL_TABLET | Freq: Every evening | ORAL | 2 refills | Status: DC | PRN

## 2022-10-05 NOTE — Telephone Encounter (Signed)
Caller & Relationship to patient: Self  Call back number: [phone]   Date of last office visit: 12.14.23  Date of next office visit: N/A  Medication(s) to be refilled:  ALPRAZolam (XANAX) 0.5 MG tablet   losartan (COZAAR) 50 MG tablet   Vitamin D, Ergocalciferol, (DRISDOL) 1.25 MG (50000 UNIT) CAPS capsule   Preferred Pharmacy:   [id number]   Phone: [phone]  Fax: [phone]

## 2022-10-29 ENCOUNTER — Other Ambulatory Visit: Payer: Self-pay | Admitting: Internal Medicine

## 2022-10-29 DIAGNOSIS — E559 Vitamin D deficiency, unspecified: Secondary | ICD-10-CM

## 2022-10-30 ENCOUNTER — Ambulatory Visit: Payer: BC Managed Care – PPO | Admitting: Gastroenterology

## 2022-10-30 ENCOUNTER — Other Ambulatory Visit (INDEPENDENT_AMBULATORY_CARE_PROVIDER_SITE_OTHER): Payer: BC Managed Care – PPO

## 2022-10-30 ENCOUNTER — Encounter: Payer: Self-pay | Admitting: Gastroenterology

## 2022-10-30 VITALS — BP 122/76 | HR 68 | Ht 62.0 in | Wt 171.0 lb

## 2022-10-30 DIAGNOSIS — R194 Change in bowel habit: Secondary | ICD-10-CM

## 2022-10-30 DIAGNOSIS — K760 Fatty (change of) liver, not elsewhere classified: Secondary | ICD-10-CM

## 2022-10-30 DIAGNOSIS — R140 Abdominal distension (gaseous): Secondary | ICD-10-CM

## 2022-10-30 DIAGNOSIS — K219 Gastro-esophageal reflux disease without esophagitis: Secondary | ICD-10-CM | POA: Diagnosis not present

## 2022-10-30 DIAGNOSIS — R109 Unspecified abdominal pain: Secondary | ICD-10-CM

## 2022-10-30 LAB — HEMOGLOBIN A1C: Hgb A1c MFr Bld: 6.1 % (ref 4.6–6.5)

## 2022-10-30 MED ORDER — CITRUCEL PO POWD: 1.0000 | Freq: Every day | ORAL | Status: DC

## 2022-10-30 MED ORDER — DICYCLOMINE HCL 10 MG PO CAPS: 10.0000 mg | ORAL_CAPSULE | Freq: Three times a day (TID) | ORAL | 1 refills | Status: DC | PRN

## 2022-10-30 NOTE — Progress Notes (Signed)
HPI :  67 year old female with a history of GERD/dysphagia, abdominal discomfort, constipation, fatty liver, here to establish care for these issues.  This is her first time seeing our practice.  She was previously seen by Dr. Octaviano Glow  She reports main symptoms have been abdominal discomfort that is bothered her.  She endorses feeling a "bubble" in her left mid to upper abdomen at times which can come and go.  It has been ongoing for years, she feels it 3-4 times per week to month and can last for a few hours at a time.  She does not have daily symptoms that bother her.  She also has discomfort diffusely in her mid abdomen to right side as well that is less common.  She feels bloated and gassy with this.  She can have occasional relief with a bowel movement but not every time.  She does endorse occasional constipation and sense of feeling incompletely evacuated at times.  She endorses foul-smelling gas that can bother her occasionally.  She denies any precipitating or alleviating factors to this.  No blood in her stools.  She has occasional hemorrhoids that cause irritation and superficial bleeding.    Her father had colon cancer diagnosed she thinks at age 73s to early 6s.  Her last colonoscopy was in April 2022 and was normal.  She otherwise reports a history of having reflux and occasional dysphagia in the past.  She states diet can significantly influence her symptoms such as eating onions and garlic etc.  She takes currently famotidine every day at baseline which mostly controls her symptoms.  She had will have an occasional Prilosec as needed for breakthrough and uses that perhaps once weekly.  She is fairly happy with the regimen.  She denies any nausea or vomiting.  Weight stable.  No routine upper abdominal pain.  Of note she had an EGD back in April 2022 to evaluate some of the symptoms and she had an empiric dilation for dysphagia.  She unfortunately admitted to [facility] for perforated  esophagus it was managed conservatively for 6 days in the hospital.  She recovered over time.  She states she has not had any bothersome dysphagia to her over time since then.  During that workup for her perforation she had a CT scan of her chest abdomen pelvis, incidentally noted to have fatty liver which was new to her.  She has historically had normal liver enzymes.  She does not have any excessive alcohol use, perhaps will have 1 alcoholic beverage per week.  She does drink coffee once daily.  Her body mass index is 31.28.  Denies any medications that would cause steatosis.  No family history of liver disease that she is aware of.   EGD 01/21/21: Dr. Octaviano Glow - no EoE - had microperforation? Findings The esophageal mucosa appeared normal. Biopsies were done from the distal and proximal esophagus to rule out Eosinophilic Esophagitis. Empiric Dilation was done with a 54mm Savary Dilator over a guidewire with minimal resistance. The gastric mucosa appeared normal. The first and second part of the duodenum appeared normal.   Colonoscopy 01/21/21: Dr. Octaviano Glow Findings Moderate left sided diverticulosis was noted. Medium Internal hemorrhoids were seen on rectal retroflection. No polyps  CT 01/21/21: IMPRESSION: 1. Fluid and a small amount of extraluminal air within the posterior mediastinum adjacent to the distal esophagus. Findings indicative of an esophageal perforation. 2. No gross free intraperitoneal air. 3. Mild long segment thickening within the sigmoid colon, which may  be reactive secondary to recent colonoscopy. No evidence of large bowel perforation. 4. Submucosal fat deposition within the cecum and ascending colon, which can be seen with chronic inflammatory bowel disease. 5. Hepatic steatosis.    Barium swallow 01/24/21:MPRESSION: No sign of esophageal leak. Mild irregularity distally potentially due to edema and postprocedural changes.    Past Medical History:   Diagnosis Date   Anxiety    Chronic pansinusitis    Deviated septum    Diverticulosis    Family history of colon cancer in father    GERD (gastroesophageal reflux disease)    History of ductal carcinoma in situ (DCIS) of breast    Hypertension    RLS (restless legs syndrome)    Spinal stenosis of lumbar region without neurogenic claudication      Past Surgical History:  Procedure Laterality Date   BREAST LUMPECTOMY Left 07/2019   WISDOM TOOTH EXTRACTION     Family History  Problem Relation Age of Onset   Heart disease Mother    Cancer Father        colon, prostate   Heart disease Father    Hypertension Sister    Stomach cancer Neg Hx    Esophageal cancer Neg Hx    Social History   Tobacco Use   Smoking status: Every Day    Packs/day: 0.50    Years: 20.00    Total pack years: 10.00    Types: Cigarettes   Smokeless tobacco: Never  Vaping Use   Vaping Use: Never used  Substance Use Topics   Alcohol use: Yes    Alcohol/week: 1.0 standard drink of alcohol    Types: 1 Shots of liquor per week    Comment: occ   Drug use: No   Current Outpatient Medications  Medication Sig Dispense Refill   acetaminophen (TYLENOL) 500 MG tablet Take 1 tablet (500 mg total) by mouth every 6 (six) hours as needed for moderate pain. 30 tablet 1   ALPRAZolam (XANAX) 0.5 MG tablet Take 1 tablet (0.5 mg total) by mouth at bedtime as needed for anxiety or sleep. 30 tablet 2   azelastine (ASTELIN) 0.1 % nasal spray Place 2 sprays into both nostrils daily as needed for allergies. 90 mL 1   famotidine (PEPCID) 20 MG tablet Take 1 tablet (20 mg total) by mouth at bedtime. 90 tablet 1   fluticasone (FLONASE) 50 MCG/ACT nasal spray Place 1 spray into both nostrils 2 (two) times daily. (Patient taking differently: Place 1 spray into both nostrils daily.) 16 g 2   losartan (COZAAR) 50 MG tablet Take 1 tablet (50 mg total) by mouth daily. 90 tablet 0   montelukast (SINGULAIR) 10 MG tablet Take 1  tablet by mouth daily as needed (allergies).     omeprazole (PRILOSEC OTC) 20 MG tablet Take 20 mg by mouth daily.     traMADol (ULTRAM-ER) 100 MG 24 hr tablet Take 1 tablet (100 mg total) by mouth daily as needed for pain. 90 tablet 1   Vitamin D, Ergocalciferol, (DRISDOL) 1.25 MG (50000 UNIT) CAPS capsule TAKE 1 CAPSULE BY MOUTH ONE TIME PER WEEK 12 capsule 0   zolpidem (AMBIEN) 10 MG tablet TAKE 0.5-1 TABLETS (5-10 MG TOTAL) BY MOUTH AT BEDTIME AS NEEDED FOR SLEEP. 30 tablet 2   No current facility-administered medications for this visit.   No Known Allergies   Review of Systems: All systems reviewed and negative except where noted in HPI.   Lab Results  Component Value Date  WBC 4.2 03/13/2022   HGB 13.5 03/13/2022   HCT 41.1 03/13/2022   MCV 94.7 03/13/2022   PLT 280.0 03/13/2022    Lab Results  Component Value Date   CREATININE 0.82 03/13/2022   BUN 18 03/13/2022   NA 141 03/13/2022   K 4.9 03/13/2022   CL 105 03/13/2022   CO2 28 03/13/2022    Lab Results  Component Value Date   ALT 15 03/13/2022   AST 17 03/13/2022   ALKPHOS 92 03/13/2022   BILITOT 0.8 03/13/2022     Physical Exam: BP 122/76   Pulse 68   Ht 5\' 2"  (1.575 m)   Wt 171 lb (77.6 kg)   BMI 31.28 kg/m  Constitutional: Pleasant,well-developed, female in no acute distress. HEENT: Normocephalic and atraumatic. Conjunctivae are normal. No scleral icterus. Neck supple.  Cardiovascular: Normal rate, regular rhythm.  Pulmonary/chest: Effort normal and breath sounds normal.  Abdominal: Soft, nondistended, nontender. There are no masses palpable.  Extremities: no edema Lymphadenopathy: No cervical adenopathy noted. Neurological: Alert and oriented to person place and time. Skin: Skin is warm and dry. No rashes noted. Psychiatric: Normal mood and affect. Behavior is normal.   ASSESSMENT: 67 y.o. female here for assessment of the following  1. Abdominal discomfort   2. Altered bowel habits    3. Bloating   4. Gastroesophageal reflux disease, unspecified whether esophagitis present   5. Fatty liver    Reviewed patient's history with her as above, new patient to me today.  Has had years of intermittent abdominal discomfort in the setting of constipation with gas and bloating.  CT scan in the past did not show any clear cause for this issue.  I think she likely has bowel spasm related to gas/bloating and constipation.  I think putting her on a bowel regimen and normalizing bowel habits initially would be helpful.  Recommend Citrucel once daily.  Will also give her Bentyl 10 mg every 8 hours as needed for bowel spasm and see if that helps when she has these episodes.  If she had pain more frequently could put her on a more daily preventative etc. We discussed intestinal gas and bloating and ways to manage this.  She does have some clear food triggers which she will try to avoid, otherwise reviewed a low FODMAP diet with her and see if that will help, handout provided.  I will also screen her for celiac disease with serologies to make sure negative.  We reviewed her history of reflux.  Discussed long-term risks of chronic PPI use.  She is managing this mostly with H2 blockers which is good, she should continue Pepcid and can use PPI as needed.  Fortunately she recovered well from esophageal perforation on her last EGD, no further EGD needed as long as she is feeling well.  Counseled her on fatty liver noted incidentally on imaging in the past.  Her liver enzymes have been normal.  Counseled her on potential risks of fibrosis and cirrhosis over time.  She should work on weight loss to normalize BMI.  I will screen her for diabetes, her lipids have been elevated.  She drinks coffee which is good and can help prevent fibrotic change, increase to 3 cups daily as tolerated.  She should have her liver enzymes checked once yearly and can follow-up with [place] annually for this.  Regarding her family history  of colon cancer, her father had this diagnosis sounds like late 15s to early 47s.  Based on current  guidelines she would not need another colonoscopy for 10 years given the results of her last exam although her prior doctor told her to repeat 5 years.  She may consider doing this at 7 years for peace of mind.     PLAN: - Citrucel once daily - trial of Bentyl 10mg  every 8 hours PRN #30 RF1 - low FODMAPs diet for gas / bloating - lab celiac testing - TTG IgA, total IgA - continue pepcid daily, and prilosec PRN - counseled on fatty liver - work on weight loss, drink coffee - lab for A1c - LFTs yearly - consider colonoscopy 5-10 years based on her comfort level as outlined above  Jolly Mango, MD [facility]  CC: Janith Lima, MD

## 2022-10-30 NOTE — Patient Instructions (Addendum)
If you are age 66 or older, your body mass index should be between 23-30. Your Body mass index is 31.28 kg/m. If this is out of the aforementioned range listed, please consider follow up with your Primary Care Provider.  If you are age 22 or younger, your body mass index should be between 19-25. Your Body mass index is 31.28 kg/m. If this is out of the aformentioned range listed, please consider follow up with your Primary Care Provider.   ________________________________________________________   Please go to the lab in the basement of our building to have lab work done as you leave today. Hit "B" for basement when you get on the elevator.  When the doors open the lab is on your left.  We will call you with the results. Thank you.  Please purchase the following medications over the counter and take as directed: Citrucel - Take once daily  We are giving you a Low-FODMAP diet handout today. FODMAPs are short-chain carbohydrates (sugars) that are highly fermentable, which means that they go through chemical changes in the GI system, and are poorly absorbed during digestion. When FODMAPs reach the colon (large intestine), bacteria ferment these sugars, turning them into gas and chemicals. This stretches the walls of the colon, causing abdominal bloating, distension, cramping, pain, and/or changes in bowel habits in many patients with IBS. FODMAPs are not unhealthy or harmful, but may exacerbate GI symptoms in those with sensitive GI tracts.  We have sent the following medications to your pharmacy for you to pick up at your convenience: Bentyl 10 mg: Take every 8 hours as needed for abdominal pain  Continue Pepcid as needed  Work on weight loss.  Thank you for entrusting me with your care and for choosing [facility], Dr. Oakdale Cellar

## 2022-10-31 LAB — TISSUE TRANSGLUTAMINASE, IGA: (tTG) Ab, IgA: <1 U/mL

## 2022-10-31 LAB — IGA: Immunoglobulin A: 194 mg/dL (ref 70–320)

## 2022-11-07 ENCOUNTER — Encounter: Payer: Self-pay | Admitting: Internal Medicine

## 2022-11-07 DIAGNOSIS — M160 Bilateral primary osteoarthritis of hip: Secondary | ICD-10-CM

## 2022-11-07 DIAGNOSIS — G2581 Restless legs syndrome: Secondary | ICD-10-CM

## 2022-11-07 DIAGNOSIS — M48061 Spinal stenosis, lumbar region without neurogenic claudication: Secondary | ICD-10-CM

## 2022-11-13 ENCOUNTER — Other Ambulatory Visit: Payer: Self-pay | Admitting: Gastroenterology

## 2022-11-15 ENCOUNTER — Encounter: Payer: Self-pay | Admitting: Internal Medicine

## 2022-11-15 ENCOUNTER — Ambulatory Visit: Payer: BC Managed Care – PPO | Admitting: Internal Medicine

## 2022-11-15 VITALS — BP 122/78 | HR 82 | Temp 98.2°F | Resp 16 | Ht 62.0 in | Wt 171.0 lb

## 2022-11-15 DIAGNOSIS — I10 Essential (primary) hypertension: Secondary | ICD-10-CM

## 2022-11-15 DIAGNOSIS — Z23 Encounter for immunization: Secondary | ICD-10-CM | POA: Diagnosis not present

## 2022-11-15 DIAGNOSIS — J320 Chronic maxillary sinusitis: Secondary | ICD-10-CM | POA: Diagnosis not present

## 2022-11-15 DIAGNOSIS — R7303 Prediabetes: Secondary | ICD-10-CM

## 2022-11-15 DIAGNOSIS — E2839 Other primary ovarian failure: Secondary | ICD-10-CM

## 2022-11-15 DIAGNOSIS — R040 Epistaxis: Secondary | ICD-10-CM | POA: Diagnosis not present

## 2022-11-15 DIAGNOSIS — Z87891 Personal history of nicotine dependence: Secondary | ICD-10-CM | POA: Insufficient documentation

## 2022-11-15 MED ORDER — INSULIN PEN NEEDLE 32G X 6 MM MISC: 1.0000 | 1 refills | Status: DC

## 2022-11-15 MED ORDER — OZEMPIC (0.25 OR 0.5 MG/DOSE) 2 MG/3ML ~~LOC~~ SOPN: 0.2500 mg | PEN_INJECTOR | SUBCUTANEOUS | 0 refills | Status: DC

## 2022-11-15 NOTE — Progress Notes (Unsigned)
Subjective:  Patient ID: Cathy Goodwin, female    DOB: Jul 19, 1956  Age: 67 y.o. MRN: [id number]  CC: Hypertension and Sinusitis   HPI Cathy Goodwin presents for f/up ---  She complains of a 1-monthhistory of persistent sinus infection.  She describes taking Augmentin twice.  This past weekend she had a [facility] visit and they prescribed clindamycin and prednisone.  She had to stop taking the clindamycin because it caused stomach upset.  Outpatient Medications Prior to Visit  Medication Sig Dispense Refill   acetaminophen (TYLENOL) 500 MG tablet Take 1 tablet (500 mg total) by mouth every 6 (six) hours as needed for moderate pain. 30 tablet 1   ALPRAZolam (XANAX) 0.5 MG tablet Take 1 tablet (0.5 mg total) by mouth at bedtime as needed for anxiety or sleep. 30 tablet 2   azelastine (ASTELIN) 0.1 % nasal spray Place 2 sprays into both nostrils daily as needed for allergies. 90 mL 1   dicyclomine (BENTYL) 10 MG capsule Take 1 capsule (10 mg total) by mouth every 8 (eight) hours as needed for spasms. 30 capsule 1   famotidine (PEPCID) 20 MG tablet Take 1 tablet (20 mg total) by mouth at bedtime. 90 tablet 1   fluticasone (FLONASE) 50 MCG/ACT nasal spray Place 1 spray into both nostrils 2 (two) times daily. (Patient taking differently: Place 1 spray into both nostrils daily.) 16 g 2   losartan (COZAAR) 50 MG tablet Take 1 tablet (50 mg total) by mouth daily. 90 tablet 0   methylcellulose (CITRUCEL) oral powder Take 1 packet by mouth daily.     montelukast (SINGULAIR) 10 MG tablet Take 1 tablet by mouth daily as needed (allergies).     omeprazole (PRILOSEC OTC) 20 MG tablet Take 20 mg by mouth daily.     traMADol (ULTRAM-ER) 100 MG 24 hr tablet Take 1 tablet (100 mg total) by mouth daily as needed for pain. 90 tablet 1   Vitamin D, Ergocalciferol, (DRISDOL) 1.25 MG (50000 UNIT) CAPS capsule TAKE 1 CAPSULE BY MOUTH ONE TIME PER WEEK 12 capsule 0   zolpidem (AMBIEN) 10 MG tablet TAKE 0.5-1  TABLETS (5-10 MG TOTAL) BY MOUTH AT BEDTIME AS NEEDED FOR SLEEP. 30 tablet 2   No facility-administered medications prior to visit.    ROS Review of Systems  Constitutional:  Negative for appetite change, chills, diaphoresis, fatigue and fever.  HENT:  Positive for congestion, ear pain, nosebleeds, postnasal drip, rhinorrhea, sinus pressure, sinus pain and sore throat. Negative for facial swelling, sneezing and tinnitus.   Eyes: Negative.   Respiratory:  Negative for cough, chest tightness, shortness of breath and wheezing.   Cardiovascular:  Negative for chest pain, palpitations and leg swelling.  Gastrointestinal:  Negative for abdominal pain, diarrhea, nausea and vomiting.  Endocrine: Negative.   Genitourinary: Negative.  Negative for difficulty urinating.  Musculoskeletal: Negative.  Negative for arthralgias.  Skin: Negative.   Neurological:  Negative for dizziness and weakness.  Hematological:  Negative for adenopathy. Does not bruise/bleed easily.  Psychiatric/Behavioral: Negative.      Objective:  BP 122/78 (BP Location: Left Arm, Patient Position: Sitting, Cuff Size: Large)   Pulse 82   Temp 98.2 F (36.8 C) (Oral)   Resp 16   Ht 5' 2"$  (1.575 m)   Wt 171 lb (77.6 kg)   SpO2 96%   BMI 31.28 kg/m   BP Readings from Last 3 Encounters:  11/15/22 122/78  10/30/22 122/76  09/14/22 126/82    Wt Readings  from Last 3 Encounters:  11/15/22 171 lb (77.6 kg)  10/30/22 171 lb (77.6 kg)  09/14/22 170 lb (77.1 kg)    Physical Exam Vitals reviewed.  Constitutional:      Appearance: She is not ill-appearing.  HENT:     Right Ear: Hearing, tympanic membrane, ear canal and external ear normal. No middle ear effusion.     Left Ear: Hearing, tympanic membrane, ear canal and external ear normal.  No middle ear effusion.     Nose: No mucosal edema, congestion or rhinorrhea.     Right Nostril: No epistaxis.     Left Nostril: No epistaxis.     Right Sinus: Maxillary sinus  tenderness present. No frontal sinus tenderness.     Left Sinus: Maxillary sinus tenderness present. No frontal sinus tenderness.  Eyes:     General: No scleral icterus.    Conjunctiva/sclera: Conjunctivae normal.  Cardiovascular:     Rate and Rhythm: Normal rate and regular rhythm.     Heart sounds: No murmur heard. Pulmonary:     Effort: Pulmonary effort is normal.     Breath sounds: No stridor. No wheezing, rhonchi or rales.  Abdominal:     General: Abdomen is flat.     Palpations: There is no mass.     Tenderness: There is no abdominal tenderness. There is no guarding.     Hernia: No hernia is present.  Musculoskeletal:        General: Normal range of motion.     Cervical back: Neck supple.     Right lower leg: No edema.     Left lower leg: No edema.  Lymphadenopathy:     Cervical: No cervical adenopathy.  Skin:    General: Skin is warm and dry.  Neurological:     General: No focal deficit present.     Mental Status: She is alert. Mental status is at baseline.  Psychiatric:        Mood and Affect: Mood normal.        Behavior: Behavior normal.     Lab Results  Component Value Date   WBC 8.5 11/15/2022   HGB 14.9 11/15/2022   HCT 44.6 11/15/2022   PLT 306.0 11/15/2022   GLUCOSE 84 11/15/2022   CHOL 212 (H) 03/13/2022   TRIG 154.0 (H) 03/13/2022   HDL 59.00 03/13/2022   LDLCALC 122 (H) 03/13/2022   ALT 15 03/13/2022   AST 17 03/13/2022   NA 138 11/15/2022   K 4.6 11/15/2022   CL 102 11/15/2022   CREATININE 0.91 11/15/2022   BUN 19 11/15/2022   CO2 27 11/15/2022   TSH 1.54 03/13/2022   HGBA1C 6.1 10/30/2022    MM 3D SCREEN BREAST BILATERAL  Result Date: 04/03/2022 CLINICAL DATA:  Screening. EXAM: DIGITAL SCREENING BILATERAL MAMMOGRAM WITH TOMOSYNTHESIS AND CAD TECHNIQUE: Bilateral screening digital craniocaudal and mediolateral oblique mammograms were obtained. Bilateral screening digital breast tomosynthesis was performed. The images were evaluated with  computer-aided detection. COMPARISON:  Previous exam(s). ACR Breast Density Category b: There are scattered areas of fibroglandular density. FINDINGS: There are no findings suspicious for malignancy. IMPRESSION: No mammographic evidence of malignancy. A result letter of this screening mammogram will be mailed directly to the patient. RECOMMENDATION: Screening mammogram in one year. (Code:SM-B-01Y) BI-RADS CATEGORY  1: Negative. Electronically Signed   By: Nolon Nations M.D.   On: 04/03/2022 09:57   Assessment & Plan:   Cathy Goodwin was seen today for hypertension and sinusitis.  Diagnoses and all  orders for this visit:  Primary hypertension- Her blood pressure is adequately well-controlled. -     Basic metabolic panel; Future -     CBC with Differential/Platelet; Future -     CBC with Differential/Platelet -     Basic metabolic panel  Epistaxis- Will evaluate for mass and malignancy. -     CT Maxillofacial W/Cm; Future -     CBC with Differential/Platelet; Future -     CBC with Differential/Platelet  Chronic maxillary sinusitis- Will see if there is an obstruction or air-fluid levels. -     CT Maxillofacial W/Cm; Future  Need for vaccination -     Pneumococcal conjugate vaccine 20-valent  Former cigarette smoker -     Ambulatory Referral for Lung Cancer Scre  Estrogen deficiency -     DG Bone Density; Future  Stage 2 type 1 prediabetes -     Semaglutide,0.25 or 0.5MG/DOS, (OZEMPIC, 0.25 OR 0.5 MG/DOSE,) 2 MG/3ML SOPN; Inject 0.25 mg into the skin once a week. -     Insulin Pen Needle 32G X 6 MM MISC; 1 Act by Does not apply route once a week.   I am having Cathy Goodwin start on Ozempic (0.25 or 0.5 MG/DOSE) and Insulin Pen Needle. I am also having her maintain her fluticasone, montelukast, traMADol, famotidine, azelastine, acetaminophen, zolpidem, losartan, ALPRAZolam, Vitamin D (Ergocalciferol), omeprazole, dicyclomine, and Citrucel.  Meds ordered this encounter  Medications    Semaglutide,0.25 or 0.5MG/DOS, (OZEMPIC, 0.25 OR 0.5 MG/DOSE,) 2 MG/3ML SOPN    Sig: Inject 0.25 mg into the skin once a week.    Dispense:  3 mL    Refill:  0   Insulin Pen Needle 32G X 6 MM MISC    Sig: 1 Act by Does not apply route once a week.    Dispense:  100 each    Refill:  1     Follow-up: Return in about 4 months (around 03/16/2023).  Cathy Calico, MD

## 2022-11-15 NOTE — Patient Instructions (Signed)

## 2022-11-16 LAB — CBC WITH DIFFERENTIAL/PLATELET
Basophils Absolute: 0.1 10*3/uL (ref 0.0–0.1)
Basophils Relative: 0.8 % (ref 0.0–3.0)
Eosinophils Absolute: 0.4 10*3/uL (ref 0.0–0.7)
Eosinophils Relative: 4.6 % (ref 0.0–5.0)
HCT: 44.6 % (ref 36.0–46.0)
Hemoglobin: 14.9 g/dL (ref 12.0–15.0)
Lymphocytes Relative: 45.4 % (ref 12.0–46.0)
Lymphs Abs: 3.9 10*3/uL (ref 0.7–4.0)
MCHC: 33.5 g/dL (ref 30.0–36.0)
MCV: 92.8 fl (ref 78.0–100.0)
Monocytes Absolute: 0.8 10*3/uL (ref 0.1–1.0)
Monocytes Relative: 9.1 % (ref 3.0–12.0)
Neutro Abs: 3.4 10*3/uL (ref 1.4–7.7)
Neutrophils Relative %: 40.1 % — ABNORMAL LOW (ref 43.0–77.0)
Platelets: 306 10*3/uL (ref 150.0–400.0)
RBC: 4.81 Mil/uL (ref 3.87–5.11)
RDW: 14 % (ref 11.5–15.5)
WBC: 8.5 10*3/uL (ref 4.0–10.5)

## 2022-11-16 LAB — BASIC METABOLIC PANEL
BUN: 19 mg/dL (ref 6–23)
CO2: 27 mEq/L (ref 19–32)
Calcium: 9.4 mg/dL (ref 8.4–10.5)
Chloride: 102 mEq/L (ref 96–112)
Creatinine, Ser: 0.91 mg/dL (ref 0.40–1.20)
GFR: 66.36 mL/min (ref >60.00)
Glucose, Bld: 84 mg/dL (ref 70–99)
Potassium: 4.6 mEq/L (ref 3.5–5.1)
Sodium: 138 mEq/L (ref 135–145)

## 2022-11-17 ENCOUNTER — Other Ambulatory Visit: Payer: Self-pay | Admitting: Internal Medicine

## 2022-11-17 DIAGNOSIS — K219 Gastro-esophageal reflux disease without esophagitis: Secondary | ICD-10-CM

## 2022-11-20 ENCOUNTER — Telehealth: Payer: Self-pay

## 2022-11-20 NOTE — Telephone Encounter (Signed)
Key: [id number]

## 2022-11-20 NOTE — Telephone Encounter (Signed)
Per CoverMyMeds: ? ?PA was denied.  ?

## 2022-11-21 MED ORDER — TRAMADOL HCL ER 100 MG PO TB24: 100.0000 mg | ORAL_TABLET | Freq: Every day | ORAL | 0 refills | Status: DC | PRN

## 2022-11-26 ENCOUNTER — Ambulatory Visit (HOSPITAL_BASED_OUTPATIENT_CLINIC_OR_DEPARTMENT_OTHER): Admission: RE | Admit: 2022-11-26 | Payer: BC Managed Care – PPO | Source: Ambulatory Visit

## 2022-12-18 ENCOUNTER — Other Ambulatory Visit: Payer: Self-pay | Admitting: Internal Medicine

## 2022-12-18 DIAGNOSIS — G2581 Restless legs syndrome: Secondary | ICD-10-CM

## 2022-12-18 DIAGNOSIS — E559 Vitamin D deficiency, unspecified: Secondary | ICD-10-CM

## 2022-12-18 DIAGNOSIS — F5104 Psychophysiologic insomnia: Secondary | ICD-10-CM

## 2023-01-04 ENCOUNTER — Other Ambulatory Visit: Payer: Self-pay | Admitting: Internal Medicine

## 2023-01-04 DIAGNOSIS — I10 Essential (primary) hypertension: Secondary | ICD-10-CM

## 2023-01-16 ENCOUNTER — Other Ambulatory Visit: Payer: Self-pay | Admitting: Internal Medicine

## 2023-01-16 DIAGNOSIS — F5104 Psychophysiologic insomnia: Secondary | ICD-10-CM

## 2023-01-16 DIAGNOSIS — F411 Generalized anxiety disorder: Secondary | ICD-10-CM

## 2023-01-16 DIAGNOSIS — G2581 Restless legs syndrome: Secondary | ICD-10-CM

## 2023-01-23 ENCOUNTER — Ambulatory Visit
Admission: RE | Admit: 2023-01-23 | Discharge: 2023-01-23 | Disposition: A | Discharge status: Home / Self Care | Payer: BC Managed Care – PPO | Source: Ambulatory Visit | Attending: Nurse Practitioner | Admitting: Nurse Practitioner

## 2023-01-23 VITALS — BP 125/79 | HR 78 | Temp 98.2°F | Resp 18

## 2023-01-23 DIAGNOSIS — H60502 Unspecified acute noninfective otitis externa, left ear: Secondary | ICD-10-CM | POA: Diagnosis not present

## 2023-01-23 MED ORDER — OFLOXACIN 0.3 % OT SOLN: 10.0000 [drp] | Freq: Every day | OTIC | 0 refills | Status: AC

## 2023-01-23 NOTE — Discharge Instructions (Signed)
You have an ear infection in the skin in your external ear  Please use the ear drops as prescribed  Avoid getting water in your ear, use a Vaseline tipped cotton ball to help prevent water from entering the ear when in the shower  Seek care for persistent or worsening symptoms despite treatment

## 2023-01-23 NOTE — ED Triage Notes (Signed)
Pt c/o left sided earache, tender dizziness, occasional off balance x 2 days. Pt denies injury to ear.    Pt has taken tylenol and aleve to help with discomfort.

## 2023-01-23 NOTE — ED Provider Notes (Signed)
[facility]    CSN: [id number] Arrival date & time: 01/23/23  1012      History   Chief Complaint Chief Complaint  Patient presents with   Ear Injury    Ear ache - Entered by patient    HPI Cathy Goodwin is a 67 y.o. female.   Patient presents today with 2-day history of left-sided ear pain, tenderness to touch, dizziness, and occasionally feeling off balance.  Patient reports the pain is currently a 5 out of 10, occasionally shoots to an 8-9 out of 10.  No fever, ear drainage, recent cough, congestion, or sore throat.  Reports the ear is a little bit itchy.  No hearing loss, recent water immersion.  Reports she has had ear infections as an adult, however it has been a little while back.  She reports she uses Q-tips "only on the edge of my ear".  Has taken Tylenol/Aleve without any improvement in symptoms.    Past Medical History:  Diagnosis Date   Anxiety    Chronic pansinusitis    Deviated septum    Diverticulosis    Family history of colon cancer in father    GERD (gastroesophageal reflux disease)    History of ductal carcinoma in situ (DCIS) of breast    Hypertension    RLS (restless legs syndrome)    Spinal stenosis of lumbar region without neurogenic claudication     Patient Active Problem List   Diagnosis Date Noted   Former cigarette smoker 11/15/2022   Epistaxis 11/15/2022   Chronic maxillary sinusitis 11/15/2022   Need for vaccination 11/15/2022   Estrogen deficiency 11/15/2022   Encounter for general adult medical examination with abnormal findings 03/13/2022   NASH (nonalcoholic steatohepatitis) 03/13/2022   GAD (generalized anxiety disorder) 03/13/2022   Psychophysiological insomnia 03/13/2022   Esophageal dysphagia 03/13/2022   Primary osteoarthritis of both hips 03/13/2022   Spinal stenosis of lumbar region without neurogenic claudication 03/13/2022   Visit for screening mammogram 03/13/2022   Primary hypertension 03/13/2022   BMI  29.0-29.9,adult 04/12/2016   Restless leg 07/14/2014   Acid reflux 07/14/2014    Past Surgical History:  Procedure Laterality Date   BREAST LUMPECTOMY Left 07/2019   WISDOM TOOTH EXTRACTION      OB History   No obstetric history on file.      Home Medications    Prior to Admission medications   Medication Sig Start Date End Date Taking? Authorizing Provider  ofloxacin (FLOXIN) 0.3 % OTIC solution Place 10 drops into the left ear daily for 7 days. 01/23/23 01/30/23 Yes Valentino Nose, NP  acetaminophen (TYLENOL) 500 MG tablet Take 1 tablet (500 mg total) by mouth every 6 (six) hours as needed for moderate pain. 06/26/22   Etta Grandchild, MD  ALPRAZolam Prudy Feeler) 0.5 MG tablet TAKE 1 TABLET (0.5 MG TOTAL) BY MOUTH AT BEDTIME AS NEEDED FOR ANXIETY OR SLEEP. 01/16/23   Etta Grandchild, MD  azelastine (ASTELIN) 0.1 % nasal spray Place 2 sprays into both nostrils daily as needed for allergies. 06/26/22   Etta Grandchild, MD  dicyclomine (BENTYL) 10 MG capsule Take 1 capsule (10 mg total) by mouth every 8 (eight) hours as needed for spasms. 10/30/22   Benancio Deeds, MD  famotidine (PEPCID) 20 MG tablet TAKE 1 TABLET BY MOUTH EVERYDAY AT BEDTIME 11/17/22   Etta Grandchild, MD  fluticasone [facility]) 50 MCG/ACT nasal spray Place 1 spray into both nostrils 2 (two) times daily. Patient taking  differently: Place 1 spray into both nostrils daily. 10/10/17   Remus Loffler, PA-C  Insulin Pen Needle 32G X 6 MM MISC 1 Act by Does not apply route once a week. 11/15/22   Etta Grandchild, MD  losartan (COZAAR) 50 MG tablet TAKE 1 TABLET BY MOUTH EVERY DAY 01/04/23   Etta Grandchild, MD  methylcellulose (CITRUCEL) oral powder Take 1 packet by mouth daily. 10/30/22   Armbruster, Willaim Rayas, MD  montelukast (SINGULAIR) 10 MG tablet Take 1 tablet by mouth daily as needed (allergies). 11/26/20   [provider]  omeprazole (PRILOSEC OTC) 20 MG tablet Take 20 mg by mouth daily.    [provider]   Semaglutide,0.25 or 0.5MG /DOS, (OZEMPIC, 0.25 OR 0.5 MG/DOSE,) 2 MG/3ML SOPN Inject 0.25 mg into the skin once a week. 11/15/22   Etta Grandchild, MD  traMADol (ULTRAM-ER) 100 MG 24 hr tablet Take 1 tablet (100 mg total) by mouth daily as needed for pain. 11/21/22   Pincus Sanes, MD  Vitamin D, Ergocalciferol, (DRISDOL) 1.25 MG (50000 UNIT) CAPS capsule TAKE 1 CAPSULE BY MOUTH ONE TIME PER WEEK 12/19/22   Etta Grandchild, MD  zolpidem (AMBIEN) 10 MG tablet TAKE 0.5-1 TABLETS (5-10 MG TOTAL) BY MOUTH AT BEDTIME AS NEEDED FOR SLEEP. 12/19/22   Etta Grandchild, MD    Family History Family History  Problem Relation Age of Onset   Heart disease Mother    Cancer Father        colon, prostate   Heart disease Father    Hypertension Sister    Stomach cancer Neg Hx    Esophageal cancer Neg Hx     Social History Social History   Tobacco Use   Smoking status: Every Day    Packs/day: 0.50    Years: 20.00    Additional pack years: 0.00    Total pack years: 10.00    Types: Cigarettes   Smokeless tobacco: Never  Vaping Use   Vaping Use: Never used  Substance Use Topics   Alcohol use: Yes    Alcohol/week: 1.0 standard drink of alcohol    Types: 1 Shots of liquor per week    Comment: occ   Drug use: No     Allergies   Patient has no known allergies.   Review of Systems Review of Systems Per HPI  Physical Exam Triage Vital Signs ED Triage Vitals  Enc Vitals Group     BP 01/23/23 1103 125/79     Pulse Rate 01/23/23 1103 78     Resp 01/23/23 1103 18     Temp 01/23/23 1103 98.2 F (36.8 C)     Temp Source 01/23/23 1103 Oral     SpO2 01/23/23 1103 96 %     Weight --      Height --      Head Circumference --      Peak Flow --      Pain Score 01/23/23 1106 5     Pain Loc --      Pain Edu? --      Excl. in GC? --    No data found.  Updated Vital Signs BP 125/79 (BP Location: Right Arm)   Pulse 78   Temp 98.2 F (36.8 C) (Oral)   Resp 18   SpO2 96%   Visual  Acuity Right Eye Distance:   Left Eye Distance:   Bilateral Distance:    Right Eye Near:   Left Eye  Near:    Bilateral Near:     Physical Exam Vitals and nursing note reviewed.  Constitutional:      General: She is not in acute distress.    Appearance: Normal appearance. She is not toxic-appearing.  HENT:     Head: Normocephalic and atraumatic.     Right Ear: Tympanic membrane, ear canal and external ear normal. There is no impacted cerumen.     Left Ear: No decreased hearing noted. Swelling and tenderness present. Tympanic membrane is not erythematous, retracted or bulging.     Ears:     Comments: Left external auditory canal is erythematous, tender with manipulation of pinna, and tender when visualized with speculum via otoscope     Nose: Nose normal. No congestion or rhinorrhea.     Mouth/Throat:     Mouth: Mucous membranes are moist.     Pharynx: Oropharynx is clear.  Pulmonary:     Effort: Pulmonary effort is normal. No respiratory distress.  Skin:    General: Skin is warm and dry.     Capillary Refill: Capillary refill takes less than 2 seconds.     Coloration: Skin is not jaundiced or pale.     Findings: No bruising or erythema.  Neurological:     Mental Status: She is alert and oriented to person, place, and time.  Psychiatric:        Behavior: Behavior is cooperative.      UC Treatments / Results  Labs (all labs ordered are listed, but only abnormal results are displayed) Labs Reviewed - No data to display  EKG   Radiology No results found.  Procedures Procedures (including critical care time)  Medications Ordered in UC Medications - No data to display  Initial Impression / Assessment and Plan / UC Course  I have reviewed the triage vital signs and the nursing notes.  Pertinent labs & imaging results that were available during my care of the patient were reviewed by me and considered in my medical decision making (see chart for details).    Patient is well-appearing, normotensive, afebrile, not tachycardic, not tachypneic, oxygenating well on room air.    1. Acute otitis externa of left ear, unspecified type Treat with ofloxacin drops Ear precautions discussed ER and return precautions discussed with patient  The patient was given the opportunity to ask questions.  All questions answered to their satisfaction.  The patient is in agreement to this plan.    Final Clinical Impressions(s) / UC Diagnoses   Final diagnoses:  Acute otitis externa of left ear, unspecified type     Discharge Instructions      You have an ear infection in the skin in your external ear  Please use the ear drops as prescribed  Avoid getting water in your ear, use a Vaseline tipped cotton ball to help prevent water from entering the ear when in the shower  Seek care for persistent or worsening symptoms despite treatment     ED Prescriptions     Medication Sig Dispense Auth. Provider   ofloxacin (FLOXIN) 0.3 % OTIC solution Place 10 drops into the left ear daily for 7 days. 5 mL Valentino Nose, NP      PDMP not reviewed this encounter.   Valentino Nose, NP 01/23/23 1335

## 2023-02-14 ENCOUNTER — Other Ambulatory Visit: Payer: Self-pay | Admitting: Internal Medicine

## 2023-02-14 DIAGNOSIS — F5104 Psychophysiologic insomnia: Secondary | ICD-10-CM

## 2023-02-14 DIAGNOSIS — G2581 Restless legs syndrome: Secondary | ICD-10-CM

## 2023-02-23 ENCOUNTER — Encounter: Payer: Self-pay | Admitting: Emergency Medicine

## 2023-04-06 ENCOUNTER — Other Ambulatory Visit: Payer: Self-pay | Admitting: Internal Medicine

## 2023-04-06 DIAGNOSIS — I10 Essential (primary) hypertension: Secondary | ICD-10-CM

## 2023-04-12 ENCOUNTER — Other Ambulatory Visit: Payer: Self-pay | Admitting: Internal Medicine

## 2023-04-12 DIAGNOSIS — F5104 Psychophysiologic insomnia: Secondary | ICD-10-CM

## 2023-04-12 DIAGNOSIS — G2581 Restless legs syndrome: Secondary | ICD-10-CM

## 2023-04-12 DIAGNOSIS — F411 Generalized anxiety disorder: Secondary | ICD-10-CM

## 2023-04-12 DIAGNOSIS — M48061 Spinal stenosis, lumbar region without neurogenic claudication: Secondary | ICD-10-CM

## 2023-04-12 DIAGNOSIS — M160 Bilateral primary osteoarthritis of hip: Secondary | ICD-10-CM

## 2023-05-02 ENCOUNTER — Ambulatory Visit
Admission: RE | Admit: 2023-05-02 | Discharge: 2023-05-02 | Disposition: A | Discharge status: Home / Self Care | Payer: BC Managed Care – PPO | Source: Ambulatory Visit | Attending: Family Medicine | Admitting: Family Medicine

## 2023-05-02 ENCOUNTER — Ambulatory Visit (INDEPENDENT_AMBULATORY_CARE_PROVIDER_SITE_OTHER): Payer: BC Managed Care – PPO

## 2023-05-02 VITALS — BP 148/81 | HR 74 | Temp 98.0°F | Resp 20

## 2023-05-02 DIAGNOSIS — M5432 Sciatica, left side: Secondary | ICD-10-CM

## 2023-05-02 DIAGNOSIS — M4726 Other spondylosis with radiculopathy, lumbar region: Secondary | ICD-10-CM | POA: Diagnosis not present

## 2023-05-02 DIAGNOSIS — S39012A Strain of muscle, fascia and tendon of lower back, initial encounter: Secondary | ICD-10-CM | POA: Diagnosis not present

## 2023-05-02 DIAGNOSIS — M4727 Other spondylosis with radiculopathy, lumbosacral region: Secondary | ICD-10-CM | POA: Diagnosis not present

## 2023-05-02 DIAGNOSIS — M4316 Spondylolisthesis, lumbar region: Secondary | ICD-10-CM | POA: Diagnosis not present

## 2023-05-02 DIAGNOSIS — M545 Low back pain, unspecified: Secondary | ICD-10-CM | POA: Diagnosis not present

## 2023-05-02 MED ORDER — PREDNISONE 20 MG PO TABS: 40.0000 mg | ORAL_TABLET | Freq: Every day | ORAL | 0 refills | Status: DC

## 2023-05-02 MED ORDER — TIZANIDINE HCL 2 MG PO CAPS: 2.0000 mg | ORAL_CAPSULE | Freq: Three times a day (TID) | ORAL | 0 refills | Status: DC | PRN

## 2023-05-02 NOTE — ED Provider Notes (Signed)
[facility]    CSN: [id number] Arrival date & time: 05/02/23  1552      History   Chief Complaint Chief Complaint  Patient presents with   Hip Pain    HPI Ovee Pendleton is a 67 y.o. female.   Presenting today with 4-day history of left-sided low back pain radiating down the back of left leg particularly with movement.  Denies known injury, bowel or bladder incontinence, saddle anesthesia, fever, urinary symptoms.  So far trying over-the-counter pain relievers with minimal relief.    Past Medical History:  Diagnosis Date   Anxiety    Chronic pansinusitis    Deviated septum    Diverticulosis    Family history of colon cancer in father    GERD (gastroesophageal reflux disease)    History of ductal carcinoma in situ (DCIS) of breast    Hypertension    RLS (restless legs syndrome)    Spinal stenosis of lumbar region without neurogenic claudication     Patient Active Problem List   Diagnosis Date Noted   Former cigarette smoker 11/15/2022   Epistaxis 11/15/2022   Chronic maxillary sinusitis 11/15/2022   Need for vaccination 11/15/2022   Estrogen deficiency 11/15/2022   Encounter for general adult medical examination with abnormal findings 03/13/2022   NASH (nonalcoholic steatohepatitis) 03/13/2022   GAD (generalized anxiety disorder) 03/13/2022   Psychophysiological insomnia 03/13/2022   Esophageal dysphagia 03/13/2022   Primary osteoarthritis of both hips 03/13/2022   Spinal stenosis of lumbar region without neurogenic claudication 03/13/2022   Visit for screening mammogram 03/13/2022   Primary hypertension 03/13/2022   BMI 29.0-29.9,adult 04/12/2016   Restless leg 07/14/2014   Acid reflux 07/14/2014    Past Surgical History:  Procedure Laterality Date   BREAST LUMPECTOMY Left 07/2019   WISDOM TOOTH EXTRACTION      OB History   No obstetric history on file.      Home Medications    Prior to Admission medications   Medication Sig Start  Date End Date Taking? Authorizing Provider  predniSONE (DELTASONE) 20 MG tablet Take 2 tablets (40 mg total) by mouth daily with breakfast. 05/02/23  Yes Particia Nearing, PA-C  tizanidine (ZANAFLEX) 2 MG capsule Take 1 capsule (2 mg total) by mouth 3 (three) times daily as needed for muscle spasms. Do not drink alcohol or drive while taking this medication.  May cause drowsiness. 05/02/23  Yes Particia Nearing, PA-C  acetaminophen (TYLENOL) 500 MG tablet Take 1 tablet (500 mg total) by mouth every 6 (six) hours as needed for moderate pain. 06/26/22   Etta Grandchild, MD  ALPRAZolam Prudy Feeler) 0.5 MG tablet TAKE 1TAB BY MOUTH AT BEDTIME AS NEEDED FOR ANXIETY/SLEEP 04/13/23   Etta Grandchild, MD  azelastine (ASTELIN) 0.1 % nasal spray Place 2 sprays into both nostrils daily as needed for allergies. 06/26/22   Etta Grandchild, MD  dicyclomine (BENTYL) 10 MG capsule Take 1 capsule (10 mg total) by mouth every 8 (eight) hours as needed for spasms. 10/30/22   Benancio Deeds, MD  famotidine (PEPCID) 20 MG tablet TAKE 1 TABLET BY MOUTH EVERYDAY AT BEDTIME 11/17/22   Etta Grandchild, MD  fluticasone [facility]) 50 MCG/ACT nasal spray Place 1 spray into both nostrils 2 (two) times daily. Patient taking differently: Place 1 spray into both nostrils daily. 10/10/17   Remus Loffler, PA-C  Insulin Pen Needle 32G X 6 MM MISC 1 Act by Does not apply route once a week. 11/15/22  Etta Grandchild, MD  losartan (COZAAR) 50 MG tablet TAKE 1 TABLET BY MOUTH EVERY DAY 04/06/23   Etta Grandchild, MD  methylcellulose (CITRUCEL) oral powder Take 1 packet by mouth daily. 10/30/22   Armbruster, Willaim Rayas, MD  montelukast (SINGULAIR) 10 MG tablet Take 1 tablet by mouth daily as needed (allergies). 11/26/20   [provider]  omeprazole (PRILOSEC OTC) 20 MG tablet Take 20 mg by mouth daily.    [provider]  Semaglutide,0.25 or 0.5MG /DOS, (OZEMPIC, 0.25 OR 0.5 MG/DOSE,) 2 MG/3ML SOPN Inject 0.25 mg into the  skin once a week. 11/15/22   Etta Grandchild, MD  traMADol (ULTRAM-ER) 100 MG 24 hr tablet TAKE 1 TABLET (100 MG TOTAL) BY MOUTH DAILY AS NEEDED FOR PAIN. 04/13/23   Etta Grandchild, MD  Vitamin D, Ergocalciferol, (DRISDOL) 1.25 MG (50000 UNIT) CAPS capsule TAKE 1 CAPSULE BY MOUTH ONE TIME PER WEEK 12/19/22   Etta Grandchild, MD  zolpidem (AMBIEN) 10 MG tablet TAKE 0.5-1 TABLET (5-10 MG TOTAL) BY MOUTH AT BEDTIME AS NEEDED FOR SLEEP. 04/13/23   Etta Grandchild, MD    Family History Family History  Problem Relation Age of Onset   Heart disease Mother    Cancer Father        colon, prostate   Heart disease Father    Hypertension Sister    Stomach cancer Neg Hx    Esophageal cancer Neg Hx     Social History Social History   Tobacco Use   Smoking status: Every Day    Current packs/day: 0.50    Average packs/day: 0.5 packs/day for 20.0 years (10.0 ttl pk-yrs)    Types: Cigarettes   Smokeless tobacco: Never  Vaping Use   Vaping status: Never Used  Substance Use Topics   Alcohol use: Yes    Alcohol/week: 1.0 standard drink of alcohol    Types: 1 Shots of liquor per week    Comment: occ   Drug use: No     Allergies   Patient has no known allergies.   Review of Systems Review of Systems Per HPI  Physical Exam Triage Vital Signs ED Triage Vitals  Encounter Vitals Group     BP 05/02/23 1557 (!) 148/81     Systolic BP Percentile --      Diastolic BP Percentile --      Pulse Rate 05/02/23 1557 74     Resp 05/02/23 1557 20     Temp 05/02/23 1557 98 F (36.7 C)     Temp Source 05/02/23 1557 Oral     SpO2 05/02/23 1557 96 %     Weight --      Height --      Head Circumference --      Peak Flow --      Pain Score 05/02/23 1559 7     Pain Loc --      Pain Education --      Exclude from Growth Chart --    No data found.  Updated Vital Signs BP (!) 148/81 (BP Location: Right Arm)   Pulse 74   Temp 98 F (36.7 C) (Oral)   Resp 20   SpO2 96%   Visual Acuity Right  Eye Distance:   Left Eye Distance:   Bilateral Distance:    Right Eye Near:   Left Eye Near:    Bilateral Near:     Physical Exam Vitals and nursing note reviewed.  Constitutional:  Appearance: Normal appearance. She is not ill-appearing.  HENT:     Head: Atraumatic.     Mouth/Throat:     Mouth: Mucous membranes are moist.  Eyes:     Extraocular Movements: Extraocular movements intact.     Conjunctiva/sclera: Conjunctivae normal.  Cardiovascular:     Rate and Rhythm: Normal rate and regular rhythm.     Heart sounds: Normal heart sounds.  Pulmonary:     Effort: Pulmonary effort is normal.     Breath sounds: Normal breath sounds.  Abdominal:     General: Bowel sounds are normal. There is no distension.     Palpations: Abdomen is soft.     Tenderness: There is no abdominal tenderness. There is no guarding.  Musculoskeletal:        General: Tenderness present. No swelling, deformity or signs of injury. Normal range of motion.     Cervical back: Normal range of motion and neck supple.     Comments: No midline spinal tenderness to palpation diffusely.  Left lumbar musculature tender to palpation.  Negative straight leg raise bilateral lower extremities.  Normal gait and range of motion  Skin:    General: Skin is warm and dry.     Findings: No erythema.  Neurological:     Mental Status: She is alert and oriented to person, place, and time.     Motor: No weakness.     Gait: Gait normal.     Comments: Bilateral lower extremities neurovascularly intact  Psychiatric:        Mood and Affect: Mood normal.        Thought Content: Thought content normal.        Judgment: Judgment normal.      UC Treatments / Results  Labs (all labs ordered are listed, but only abnormal results are displayed) Labs Reviewed - No data to display  EKG   Radiology DG Lumbar Spine Complete  Result Date: 05/02/2023 CLINICAL DATA:  Left-sided lumbar pain with left lower extremity  radiculopathy EXAM: LUMBAR SPINE - COMPLETE 4+ VIEW COMPARISON:  11/22/2016 FINDINGS: Frontal, bilateral oblique, and lateral views of the lumbar spine are obtained on 4 images. There are 5 non-rib-bearing lumbar type vertebral bodies identified, with mild grade 1 anterolisthesis of L4 on L5 likely due to degenerative change. No evidence of pars defect. There are no acute fractures. There is progressive spondylosis and facet hypertrophy at L4-5 and L5-S1. Sacroiliac joints are unremarkable. IMPRESSION: 1. Progressive lower lumbar spondylosis and facet hypertrophy. 2. No acute fracture. 3. Mild grade 1 degenerative anterolisthesis of L4 on L5. Electronically Signed   By: Sharlet Salina M.D.   On: 05/02/2023 16:55    Procedures Procedures (including critical care time)  Medications Ordered in UC Medications - No data to display  Initial Impression / Assessment and Plan / UC Course  I have reviewed the triage vital signs and the nursing notes.  Pertinent labs & imaging results that were available during my care of the patient were reviewed by me and considered in my medical decision making (see chart for details).     X-ray per patient request performed and this was negative for acute bony abnormality.  Suspect muscular strain causing some sciatica.  Given her failure of over-the-counter remedies, treat with prednisone, Zanaflex, supportive over-the-counter medications and home care.  Return for worsening symptoms.  Final Clinical Impressions(s) / UC Diagnoses   Final diagnoses:  Strain of lumbar region, initial encounter  Left sided sciatica  Discharge Instructions   None    ED Prescriptions     Medication Sig Dispense Auth. Provider   predniSONE (DELTASONE) 20 MG tablet Take 2 tablets (40 mg total) by mouth daily with breakfast. 10 tablet Particia Nearing, PA-C   tizanidine (ZANAFLEX) 2 MG capsule Take 1 capsule (2 mg total) by mouth 3 (three) times daily as needed for muscle  spasms. Do not drink alcohol or drive while taking this medication.  May cause drowsiness. 15 capsule Particia Nearing, [place]      PDMP not reviewed this encounter.   Particia Nearing, [place] 05/02/23 1807

## 2023-05-02 NOTE — ED Triage Notes (Signed)
Pt reports she has low back pain that radiates to her left side and down her left leg x 4 days.

## 2023-05-09 ENCOUNTER — Ambulatory Visit: Payer: BC Managed Care – PPO | Admitting: Internal Medicine

## 2023-05-09 ENCOUNTER — Other Ambulatory Visit: Payer: Self-pay | Admitting: Internal Medicine

## 2023-05-09 ENCOUNTER — Encounter: Payer: Self-pay | Admitting: Internal Medicine

## 2023-05-09 ENCOUNTER — Other Ambulatory Visit (INDEPENDENT_AMBULATORY_CARE_PROVIDER_SITE_OTHER): Payer: BC Managed Care – PPO

## 2023-05-09 VITALS — BP 144/82 | HR 67 | Temp 98.0°F | Resp 16 | Ht 62.0 in | Wt 172.0 lb

## 2023-05-09 DIAGNOSIS — E559 Vitamin D deficiency, unspecified: Secondary | ICD-10-CM | POA: Diagnosis not present

## 2023-05-09 DIAGNOSIS — K7581 Nonalcoholic steatohepatitis (NASH): Secondary | ICD-10-CM | POA: Diagnosis not present

## 2023-05-09 DIAGNOSIS — E785 Hyperlipidemia, unspecified: Secondary | ICD-10-CM

## 2023-05-09 DIAGNOSIS — R9431 Abnormal electrocardiogram [ECG] [EKG]: Secondary | ICD-10-CM

## 2023-05-09 DIAGNOSIS — Z1231 Encounter for screening mammogram for malignant neoplasm of breast: Secondary | ICD-10-CM

## 2023-05-09 DIAGNOSIS — R040 Epistaxis: Secondary | ICD-10-CM | POA: Diagnosis not present

## 2023-05-09 DIAGNOSIS — M48061 Spinal stenosis, lumbar region without neurogenic claudication: Secondary | ICD-10-CM | POA: Diagnosis not present

## 2023-05-09 DIAGNOSIS — I10 Essential (primary) hypertension: Secondary | ICD-10-CM | POA: Diagnosis not present

## 2023-05-09 DIAGNOSIS — R7303 Prediabetes: Secondary | ICD-10-CM | POA: Diagnosis not present

## 2023-05-09 DIAGNOSIS — J0100 Acute maxillary sinusitis, unspecified: Secondary | ICD-10-CM

## 2023-05-09 DIAGNOSIS — K2100 Gastro-esophageal reflux disease with esophagitis, without bleeding: Secondary | ICD-10-CM

## 2023-05-09 DIAGNOSIS — Z0000 Encounter for general adult medical examination without abnormal findings: Secondary | ICD-10-CM | POA: Diagnosis not present

## 2023-05-09 DIAGNOSIS — Z0001 Encounter for general adult medical examination with abnormal findings: Secondary | ICD-10-CM

## 2023-05-09 DIAGNOSIS — E2839 Other primary ovarian failure: Secondary | ICD-10-CM | POA: Diagnosis not present

## 2023-05-09 DIAGNOSIS — K219 Gastro-esophageal reflux disease without esophagitis: Secondary | ICD-10-CM

## 2023-05-09 MED ORDER — FAMOTIDINE 40 MG PO TABS: 40.0000 mg | ORAL_TABLET | Freq: Every day | ORAL | 1 refills | Status: DC

## 2023-05-09 MED ORDER — AMOXICILLIN-POT CLAVULANATE 875-125 MG PO TABS
1.0000 | ORAL_TABLET | Freq: Two times a day (BID) | ORAL | 0 refills | Status: AC
Start: 2023-05-09 — End: 2023-05-19

## 2023-05-09 MED ORDER — GABAPENTIN 300 MG PO CAPS: 300.0000 mg | ORAL_CAPSULE | Freq: Every day | ORAL | 1 refills | Status: DC

## 2023-05-09 MED ORDER — OMEPRAZOLE MAGNESIUM 20 MG PO TBEC
20.0000 mg | DELAYED_RELEASE_TABLET | Freq: Every day | ORAL | 1 refills | Status: DC
Start: 2023-05-09 — End: 2024-05-19

## 2023-05-09 NOTE — Progress Notes (Signed)
Subjective:  Patient ID: Cathy Goodwin, female    DOB: [birth year]  Age: 67 y.o. MRN: [id number]  CC: Annual Exam, Hypertension, Hyperlipidemia, Gastroesophageal Reflux, Sinusitis, and Back Pain   HPI Cathy Goodwin presents for a CPX and f/up ---  Discussed the use of AI scribe software for clinical note transcription with the patient, who gave verbal consent to proceed.  History of Present Illness   The patient presents with ongoing sinus symptoms, including difficulty breathing and the daily expulsion of hard, crusty, sometimes bloody, dark green nasal discharge. Despite the recent extraction of an abscessed tooth, the symptoms persist. The patient also reports trouble breathing at night when lying down.  The patient's medication regimen includes Xanax, Tramadol as needed, Ambien for insomnia, Losartan, Vitamin D, and Famotidine or Prilosec for acid reflux, which worsens with spicy foods and when lying down. The patient denies dysphagia and odynophagia.  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 Despite [place] and acknowledging a lack of exercise, the patient denies experiencing chest pain, shortness of breath, dizziness, or lightheadedness during exertion.  The patient is not taking any medication other than Losartan for hypertension and reports no symptoms of high blood pressure. The patient was prescribed Ozempic (semaglutide) but did not take it due to lack of insurance coverage. The patient denies diabetic symptoms such as polydipsia and polyuria, and reports no changes in weight or appetite.  The patient occasionally smokes, averaging one cigarette per day, and consumes alcohol infrequently, with a small mixed drink not even every week. The patient's last mammogram was a year ago and acknowledges the need for a follow-up appointment.       Outpatient Medications Prior to Visit   Medication Sig Dispense Refill   acetaminophen (TYLENOL) 500 MG tablet Take 1 tablet (500 mg total) by mouth every 6 (six) hours as needed for moderate pain. 30 tablet 1   ALPRAZolam (XANAX) 0.5 MG tablet TAKE 1TAB BY MOUTH AT BEDTIME AS NEEDED FOR ANXIETY/SLEEP 30 tablet 2   azelastine (ASTELIN) 0.1 % nasal spray Place 2 sprays into both nostrils daily as needed for allergies. 90 mL 1   dicyclomine (BENTYL) 10 MG capsule Take 1 capsule (10 mg total) by mouth every 8 (eight) hours as needed for spasms. 30 capsule 1   fluticasone (FLONASE) 50 MCG/ACT nasal spray Place 1 spray into both nostrils 2 (two) times daily. (Patient taking differently: Place 1 spray into both nostrils daily.) 16 g 2   methylcellulose (CITRUCEL) oral powder Take 1 packet by mouth daily.     montelukast (SINGULAIR) 10 MG tablet Take 1 tablet by mouth daily as needed (allergies).     traMADol (ULTRAM-ER) 100 MG 24 hr tablet TAKE 1 TABLET (100 MG TOTAL) BY MOUTH DAILY AS NEEDED FOR PAIN. 90 tablet 0   zolpidem (AMBIEN) 10 MG tablet TAKE 0.5-1 TABLET (5-10 MG TOTAL) BY MOUTH AT BEDTIME AS NEEDED FOR SLEEP. 30 tablet 1   famotidine (PEPCID) 20 MG tablet TAKE 1 TABLET BY MOUTH EVERYDAY AT BEDTIME 90 tablet 1   losartan (COZAAR) 50 MG tablet TAKE 1 TABLET BY MOUTH EVERY DAY 90 tablet 0   omeprazole (PRILOSEC OTC) 20 MG tablet Take 20 mg by mouth daily.     predniSONE (DELTASONE) 20 MG tablet Take 2 tablets (40 mg total) by mouth daily with breakfast. 10 tablet 0   tizanidine (ZANAFLEX) 2 MG capsule Take 1  capsule (2 mg total) by mouth 3 (three) times daily as needed for muscle spasms. Do not drink alcohol or drive while taking this medication.  May cause drowsiness. 15 capsule 0   Vitamin D, Ergocalciferol, (DRISDOL) 1.25 MG (50000 UNIT) CAPS capsule TAKE 1 CAPSULE BY MOUTH ONE TIME PER WEEK 12 capsule 0   Insulin Pen Needle 32G X 6 MM MISC 1 Act by Does not apply route once a week. 100 each 1   Semaglutide,0.25 or 0.5MG /DOS,  (OZEMPIC, 0.25 OR 0.5 MG/DOSE,) 2 MG/3ML SOPN Inject 0.25 mg into the skin once a week. 3 mL 0   No facility-administered medications prior to visit.    ROS Review of Systems  Constitutional:  Negative for appetite change, chills, diaphoresis, fatigue and fever.  HENT:  Positive for congestion, nosebleeds, postnasal drip, rhinorrhea, sinus pressure and sinus pain. Negative for tinnitus.   Eyes: Negative.   Respiratory:  Negative for cough, chest tightness, shortness of breath and wheezing.   Cardiovascular:  Negative for chest pain, palpitations and leg swelling.  Gastrointestinal:  Negative for abdominal pain, constipation, diarrhea, nausea and vomiting (inusitis).  Endocrine: Negative.   Genitourinary: Negative.  Negative for difficulty urinating.  Musculoskeletal:  Positive for arthralgias and back pain. Negative for myalgias and neck pain.       She has been taking her husband's gabapentin 300 mg at bedtime to control LBP that radiates into BLE  Skin: Negative.   Neurological: Negative.  Negative for dizziness and weakness.  Hematological:  Negative for adenopathy. Does not bruise/bleed easily.  Psychiatric/Behavioral:  Positive for dysphoric mood and sleep disturbance. Negative for agitation, behavioral problems, confusion, decreased concentration, hallucinations, self-injury and suicidal ideas. The patient is nervous/anxious. The patient is not hyperactive.     Objective:  BP (!) 144/82 (BP Location: Left Arm, Patient Position: Sitting, Cuff Size: Large)   Pulse 67   Temp 98 F (36.7 C) (Oral)   Resp 16   Ht 5\' 2"  (1.575 m)   Wt 172 lb (78 kg)   SpO2 95%   BMI 31.46 kg/m   BP Readings from Last 3 Encounters:  05/09/23 (!) 144/82  05/02/23 (!) 148/81  01/23/23 125/79    Wt Readings from Last 3 Encounters:  05/09/23 172 lb (78 kg)  11/15/22 171 lb (77.6 kg)  10/30/22 171 lb (77.6 kg)    Physical Exam Vitals reviewed.  Constitutional:      Appearance: Normal  appearance.  HENT:     Mouth/Throat:     Mouth: Mucous membranes are moist.  Eyes:     General: No scleral icterus.    Conjunctiva/sclera: Conjunctivae normal.  Cardiovascular:     Rate and Rhythm: Normal rate and regular rhythm.     Heart sounds: Normal heart sounds, S1 normal and S2 normal. No murmur heard.    No gallop.     Comments: EKG- NSR, 64 bpm Minimal LVH ?anterolateral infarct No old tracing to compare  Pulmonary:     Effort: Pulmonary effort is normal.     Breath sounds: No stridor. No wheezing, rhonchi or rales.  Abdominal:     General: Abdomen is flat.     Palpations: There is no mass.     Tenderness: There is no abdominal tenderness. There is no guarding.     Hernia: No hernia is present.  Musculoskeletal:     Cervical back: Neck supple.     Right lower leg: No edema.     Left lower leg: No  edema.  Lymphadenopathy:     Cervical: No cervical adenopathy.  Skin:    General: Skin is warm and dry.     Findings: No rash.  Neurological:     General: No focal deficit present.     Mental Status: She is alert. Mental status is at baseline.  Psychiatric:        Mood and Affect: Mood normal.        Behavior: Behavior normal.     Lab Results  Component Value Date   WBC 8.5 11/15/2022   HGB 14.9 11/15/2022   HCT 44.6 11/15/2022   PLT 306.0 11/15/2022   GLUCOSE 90 05/09/2023   CHOL 213 (H) 05/09/2023   TRIG 102.0 05/09/2023   HDL 62.80 05/09/2023   LDLCALC 130 (H) 05/09/2023   ALT 15 05/09/2023   AST 14 05/09/2023   NA 139 05/09/2023   K 4.9 05/09/2023   CL 105 05/09/2023   CREATININE 0.79 05/09/2023   BUN 16 05/09/2023   CO2 29 05/09/2023   TSH 1.54 03/13/2022   HGBA1C 6.2 05/09/2023    DG Lumbar Spine Complete  Result Date: 05/02/2023 CLINICAL DATA:  Left-sided lumbar pain with left lower extremity radiculopathy EXAM: LUMBAR SPINE - COMPLETE 4+ VIEW COMPARISON:  11/22/2016 FINDINGS: Frontal, bilateral oblique, and lateral views of the lumbar spine  are obtained on 4 images. There are 5 non-rib-bearing lumbar type vertebral bodies identified, with mild grade 1 anterolisthesis of L4 on L5 likely due to degenerative change. No evidence of pars defect. There are no acute fractures. There is progressive spondylosis and facet hypertrophy at L4-5 and L5-S1. Sacroiliac joints are unremarkable. IMPRESSION: 1. Progressive lower lumbar spondylosis and facet hypertrophy. 2. No acute fracture. 3. Mild grade 1 degenerative anterolisthesis of L4 on L5. Electronically Signed   By: Sharlet Salina M.D.   On: 05/02/2023 16:55    Assessment & Plan:  Epistaxis -     Ambulatory referral to ENT  Primary hypertension- She has not achieved her blood pressure goal of 130/80.  Will switch to a more potent ARB. -     EKG 12-Lead -     Basic metabolic panel; Future -     Hepatic function panel; Future -     Olmesartan Medoxomil; Take 1 tablet (40 mg total) by mouth daily.  Dispense: 90 tablet; Refill: 1  Gastroesophageal reflux disease without esophagitis  Estrogen deficiency -     DG Bone Density; Future  Spinal stenosis of lumbar region without neurogenic claudication -     Gabapentin; Take 1 capsule (300 mg total) by mouth at bedtime.  Dispense: 90 capsule; Refill: 1  Visit for screening mammogram -     Digital Screening Mammogram, Left and Right; Future  NASH (nonalcoholic steatohepatitis) -     Hepatic function panel; Future  Vitamin D deficiency disease -     VITAMIN D 25 Hydroxy (Vit-D Deficiency, Fractures); Future -     Vitamin D (Ergocalciferol); Take 1 capsule (50,000 Units total) by mouth every 7 (seven) days.  Dispense: 12 capsule; Refill: 0  Prediabetes -     Basic metabolic panel; Future -     Hemoglobin A1c; Future  Dyslipidemia, goal LDL below 100- Will start a statin for cardiovascular risk reduction and will screen for CAD with a CCS. -     Lipoprotein A (LPA); Future -     Lipid panel; Future -     Atorvastatin Calcium; Take 1  tablet (40 mg total) by  mouth daily.  Dispense: 90 tablet; Refill: 1 -     CT CARDIAC SCORING (SELF PAY ONLY); Future  Acute non-recurrent maxillary sinusitis -     Amoxicillin-Pot Clavulanate; Take 1 tablet by mouth 2 (two) times daily for 10 days.  Dispense: 20 tablet; Refill: 0  Abnormal EKG -     ECHOCARDIOGRAM COMPLETE; Future -     CT CARDIAC SCORING (SELF PAY ONLY); Future  Gastroesophageal reflux disease with esophagitis without hemorrhage -     Famotidine; Take 1 tablet (40 mg total) by mouth daily.  Dispense: 90 tablet; Refill: 1 -     Omeprazole Magnesium; Take 1 tablet (20 mg total) by mouth daily.  Dispense: 90 tablet; Refill: 1  Encounter for general adult medical examination with abnormal findings - Exam completed, labs reviewed, vaccines reviewed and updated, cancer screenings addressed, pt ed material was given.      Follow-up: Return in about 6 months (around 11/09/2023).  Sanda Linger, MD

## 2023-05-09 NOTE — Patient Instructions (Signed)

## 2023-05-10 MED ORDER — VITAMIN D (ERGOCALCIFEROL) 1.25 MG (50000 UNIT) PO CAPS
50000.0000 [IU] | ORAL_CAPSULE | ORAL | 0 refills | Status: DC
Start: 2023-05-10 — End: 2023-06-17

## 2023-05-10 MED ORDER — OLMESARTAN MEDOXOMIL 40 MG PO TABS
40.0000 mg | ORAL_TABLET | Freq: Every day | ORAL | 1 refills | Status: DC
Start: 2023-05-10 — End: 2023-10-24

## 2023-05-10 MED ORDER — ATORVASTATIN CALCIUM 40 MG PO TABS
40.0000 mg | ORAL_TABLET | Freq: Every day | ORAL | 1 refills | Status: DC
Start: 2023-05-10 — End: 2023-10-30

## 2023-05-11 DIAGNOSIS — R9431 Abnormal electrocardiogram [ECG] [EKG]: Secondary | ICD-10-CM | POA: Insufficient documentation

## 2023-05-12 ENCOUNTER — Other Ambulatory Visit: Payer: Self-pay | Admitting: Internal Medicine

## 2023-05-12 DIAGNOSIS — E7841 Elevated Lipoprotein(a): Secondary | ICD-10-CM

## 2023-05-12 NOTE — Progress Notes (Unsigned)
Subjective:  Patient ID: Cathy Goodwin, female    DOB: [birth year]  Age: 67 y.o. MRN: [id number]  CC: No chief complaint on file.   HPI Cathy Goodwin presents for ***  Outpatient Medications Prior to Visit  Medication Sig Dispense Refill   acetaminophen (TYLENOL) 500 MG tablet Take 1 tablet (500 mg total) by mouth every 6 (six) hours as needed for moderate pain. 30 tablet 1   ALPRAZolam (XANAX) 0.5 MG tablet TAKE 1TAB BY MOUTH AT BEDTIME AS NEEDED FOR ANXIETY/SLEEP 30 tablet 2   amoxicillin-clavulanate (AUGMENTIN) 875-125 MG tablet Take 1 tablet by mouth 2 (two) times daily for 10 days. 20 tablet 0   atorvastatin (LIPITOR) 40 MG tablet Take 1 tablet (40 mg total) by mouth daily. 90 tablet 1   azelastine (ASTELIN) 0.1 % nasal spray Place 2 sprays into both nostrils daily as needed for allergies. 90 mL 1   dicyclomine (BENTYL) 10 MG capsule Take 1 capsule (10 mg total) by mouth every 8 (eight) hours as needed for spasms. 30 capsule 1   famotidine (PEPCID) 40 MG tablet Take 1 tablet (40 mg total) by mouth daily. 90 tablet 1   fluticasone (FLONASE) 50 MCG/ACT nasal spray Place 1 spray into both nostrils 2 (two) times daily. (Patient taking differently: Place 1 spray into both nostrils daily.) 16 g 2   gabapentin (NEURONTIN) 300 MG capsule Take 1 capsule (300 mg total) by mouth at bedtime. 90 capsule 1   methylcellulose (CITRUCEL) oral powder Take 1 packet by mouth daily.     montelukast (SINGULAIR) 10 MG tablet Take 1 tablet by mouth daily as needed (allergies).     olmesartan (BENICAR) 40 MG tablet Take 1 tablet (40 mg total) by mouth daily. 90 tablet 1   omeprazole (PRILOSEC OTC) 20 MG tablet Take 1 tablet (20 mg total) by mouth daily. 90 tablet 1   traMADol (ULTRAM-ER) 100 MG 24 hr tablet TAKE 1 TABLET (100 MG TOTAL) BY MOUTH DAILY AS NEEDED FOR PAIN. 90 tablet 0   Vitamin D, Ergocalciferol, (DRISDOL) 1.25 MG (50000 UNIT) CAPS capsule Take 1 capsule (50,000 Units total) by mouth every 7  (seven) days. 12 capsule 0   zolpidem (AMBIEN) 10 MG tablet TAKE 0.5-1 TABLET (5-10 MG TOTAL) BY MOUTH AT BEDTIME AS NEEDED FOR SLEEP. 30 tablet 1   No facility-administered medications prior to visit.    ROS Review of Systems  Objective:  There were no vitals taken for this visit.  BP Readings from Last 3 Encounters:  05/09/23 (!) 144/82  05/02/23 (!) 148/81  01/23/23 125/79    Wt Readings from Last 3 Encounters:  05/09/23 172 lb (78 kg)  11/15/22 171 lb (77.6 kg)  10/30/22 171 lb (77.6 kg)    Physical Exam  Lab Results  Component Value Date   WBC 8.5 11/15/2022   HGB 14.9 11/15/2022   HCT 44.6 11/15/2022   PLT 306.0 11/15/2022   GLUCOSE 90 05/09/2023   CHOL 213 (H) 05/09/2023   TRIG 102.0 05/09/2023   HDL 62.80 05/09/2023   LDLCALC 130 (H) 05/09/2023   ALT 15 05/09/2023   AST 14 05/09/2023   NA 139 05/09/2023   K 4.9 05/09/2023   CL 105 05/09/2023   CREATININE 0.79 05/09/2023   BUN 16 05/09/2023   CO2 29 05/09/2023   TSH 1.54 03/13/2022   HGBA1C 6.2 05/09/2023    DG Lumbar Spine Complete  Result Date: 05/02/2023 CLINICAL DATA:  Left-sided lumbar pain with left lower extremity  radiculopathy EXAM: LUMBAR SPINE - COMPLETE 4+ VIEW COMPARISON:  11/22/2016 FINDINGS: Frontal, bilateral oblique, and lateral views of the lumbar spine are obtained on 4 images. There are 5 non-rib-bearing lumbar type vertebral bodies identified, with mild grade 1 anterolisthesis of L4 on L5 likely due to degenerative change. No evidence of pars defect. There are no acute fractures. There is progressive spondylosis and facet hypertrophy at L4-5 and L5-S1. Sacroiliac joints are unremarkable. IMPRESSION: 1. Progressive lower lumbar spondylosis and facet hypertrophy. 2. No acute fracture. 3. Mild grade 1 degenerative anterolisthesis of L4 on L5. Electronically Signed   By: Sharlet Salina M.D.   On: 05/02/2023 16:55    Assessment & Plan:  High serum lipoprotein(a)     Follow-up: No  follow-ups on file.  Sanda Linger, MD

## 2023-05-14 ENCOUNTER — Telehealth (HOSPITAL_BASED_OUTPATIENT_CLINIC_OR_DEPARTMENT_OTHER): Payer: Self-pay | Admitting: *Deleted

## 2023-05-14 ENCOUNTER — Encounter: Payer: Self-pay | Admitting: Internal Medicine

## 2023-05-14 NOTE — Telephone Encounter (Signed)
Left message for patient to call and discuss scheduling the Echocardiogram ordered by Dr. Scarlette Calico

## 2023-05-14 NOTE — Telephone Encounter (Signed)
Patient is scheduled for echo at the [facility] location 08/13 at 10:00 A.M.

## 2023-05-15 ENCOUNTER — Ambulatory Visit
Admission: RE | Admit: 2023-05-15 | Discharge: 2023-05-15 | Disposition: A | Discharge status: Home / Self Care | Payer: BC Managed Care – PPO | Source: Ambulatory Visit | Attending: Internal Medicine | Admitting: Internal Medicine

## 2023-05-15 ENCOUNTER — Ambulatory Visit (INDEPENDENT_AMBULATORY_CARE_PROVIDER_SITE_OTHER): Payer: BC Managed Care – PPO

## 2023-05-15 ENCOUNTER — Other Ambulatory Visit: Payer: Self-pay | Admitting: Internal Medicine

## 2023-05-15 DIAGNOSIS — K760 Fatty (change of) liver, not elsewhere classified: Secondary | ICD-10-CM | POA: Diagnosis not present

## 2023-05-15 DIAGNOSIS — Z136 Encounter for screening for cardiovascular disorders: Secondary | ICD-10-CM | POA: Diagnosis not present

## 2023-05-15 DIAGNOSIS — N6489 Other specified disorders of breast: Secondary | ICD-10-CM | POA: Diagnosis not present

## 2023-05-15 DIAGNOSIS — Z1231 Encounter for screening mammogram for malignant neoplasm of breast: Secondary | ICD-10-CM

## 2023-05-15 DIAGNOSIS — E785 Hyperlipidemia, unspecified: Secondary | ICD-10-CM

## 2023-05-15 DIAGNOSIS — R9431 Abnormal electrocardiogram [ECG] [EKG]: Secondary | ICD-10-CM | POA: Diagnosis not present

## 2023-05-15 LAB — ECHOCARDIOGRAM COMPLETE
AR max vel: 2.08 cm2
AV Area VTI: 2.09 cm2
AV Area mean vel: 2.09 cm2
AV Mean grad: 3 mmHg
AV Peak grad: 6.5 mmHg
Ao pk vel: 1.27 m/s
Area-P 1/2: 4.17 cm2
Calc EF: 63.3 %
MV M vel: 4.62 m/s
MV Peak grad: 85.5 mmHg
Radius: 0.4 cm
S' Lateral: 2.39 cm
Single Plane A2C EF: 69.1 %
Single Plane A4C EF: 60.6 %

## 2023-05-22 DIAGNOSIS — M5451 Vertebrogenic low back pain: Secondary | ICD-10-CM | POA: Diagnosis not present

## 2023-05-22 DIAGNOSIS — M5136 Other intervertebral disc degeneration, lumbar region: Secondary | ICD-10-CM | POA: Diagnosis not present

## 2023-05-23 ENCOUNTER — Encounter: Payer: Self-pay | Admitting: Internal Medicine

## 2023-05-28 ENCOUNTER — Ambulatory Visit
Admission: RE | Admit: 2023-05-28 | Discharge: 2023-05-28 | Disposition: A | Discharge status: Home / Self Care | Payer: BC Managed Care – PPO | Source: Ambulatory Visit

## 2023-05-28 DIAGNOSIS — Z1231 Encounter for screening mammogram for malignant neoplasm of breast: Secondary | ICD-10-CM

## 2023-05-28 HISTORY — DX: Personal history of irradiation: Z92.3

## 2023-06-06 ENCOUNTER — Other Ambulatory Visit: Payer: Self-pay | Admitting: Internal Medicine

## 2023-06-06 DIAGNOSIS — G2581 Restless legs syndrome: Secondary | ICD-10-CM

## 2023-06-06 DIAGNOSIS — F5104 Psychophysiologic insomnia: Secondary | ICD-10-CM

## 2023-06-12 ENCOUNTER — Other Ambulatory Visit: Payer: Self-pay | Admitting: Internal Medicine

## 2023-06-12 DIAGNOSIS — R9431 Abnormal electrocardiogram [ECG] [EKG]: Secondary | ICD-10-CM

## 2023-06-12 DIAGNOSIS — R931 Abnormal findings on diagnostic imaging of heart and coronary circulation: Secondary | ICD-10-CM | POA: Insufficient documentation

## 2023-06-17 ENCOUNTER — Other Ambulatory Visit: Payer: Self-pay | Admitting: Internal Medicine

## 2023-06-17 DIAGNOSIS — E559 Vitamin D deficiency, unspecified: Secondary | ICD-10-CM

## 2023-06-18 ENCOUNTER — Encounter (HOSPITAL_COMMUNITY): Payer: Self-pay

## 2023-06-18 ENCOUNTER — Other Ambulatory Visit (HOSPITAL_COMMUNITY): Payer: Self-pay | Admitting: *Deleted

## 2023-06-18 DIAGNOSIS — R072 Precordial pain: Secondary | ICD-10-CM

## 2023-06-18 MED ORDER — METOPROLOL TARTRATE 100 MG PO TABS: 100.0000 mg | ORAL_TABLET | Freq: Once | ORAL | 0 refills | Status: DC

## 2023-06-20 ENCOUNTER — Ambulatory Visit (HOSPITAL_COMMUNITY)
Admission: RE | Admit: 2023-06-20 | Discharge: 2023-06-20 | Disposition: A | Discharge status: Home / Self Care | Payer: BC Managed Care – PPO | Source: Ambulatory Visit | Attending: Internal Medicine | Admitting: Internal Medicine

## 2023-06-20 DIAGNOSIS — R931 Abnormal findings on diagnostic imaging of heart and coronary circulation: Secondary | ICD-10-CM | POA: Diagnosis not present

## 2023-06-20 DIAGNOSIS — R9431 Abnormal electrocardiogram [ECG] [EKG]: Secondary | ICD-10-CM | POA: Insufficient documentation

## 2023-06-20 MED ORDER — NITROGLYCERIN 0.4 MG SL SUBL
0.8000 mg | SUBLINGUAL_TABLET | Freq: Once | SUBLINGUAL | Status: AC
  Administered 2023-06-20: 0.8 mg via SUBLINGUAL

## 2023-06-20 MED ORDER — IOHEXOL 350 MG/ML SOLN
95.0000 mL | Freq: Once | INTRAVENOUS | Status: AC | PRN
  Administered 2023-06-20: 95 mL via INTRAVENOUS

## 2023-06-20 MED ORDER — NITROGLYCERIN 0.4 MG SL SUBL
SUBLINGUAL_TABLET | SUBLINGUAL | Status: AC
  Filled 2023-06-20: qty 2

## 2023-06-21 ENCOUNTER — Other Ambulatory Visit: Payer: Self-pay | Admitting: Internal Medicine

## 2023-06-21 DIAGNOSIS — E2839 Other primary ovarian failure: Secondary | ICD-10-CM

## 2023-06-30 ENCOUNTER — Emergency Department (HOSPITAL_BASED_OUTPATIENT_CLINIC_OR_DEPARTMENT_OTHER): Payer: BC Managed Care – PPO | Admitting: Radiology

## 2023-06-30 ENCOUNTER — Encounter (HOSPITAL_BASED_OUTPATIENT_CLINIC_OR_DEPARTMENT_OTHER): Payer: Self-pay | Admitting: Emergency Medicine

## 2023-06-30 ENCOUNTER — Emergency Department (HOSPITAL_BASED_OUTPATIENT_CLINIC_OR_DEPARTMENT_OTHER)
Admission: EM | Admit: 2023-06-30 | Discharge: 2023-06-30 | Disposition: A | Discharge status: Home / Self Care | Payer: BC Managed Care – PPO | Attending: Emergency Medicine | Admitting: Emergency Medicine

## 2023-06-30 DIAGNOSIS — M545 Low back pain, unspecified: Secondary | ICD-10-CM | POA: Insufficient documentation

## 2023-06-30 DIAGNOSIS — M549 Dorsalgia, unspecified: Secondary | ICD-10-CM | POA: Diagnosis not present

## 2023-06-30 DIAGNOSIS — Y9389 Activity, other specified: Secondary | ICD-10-CM | POA: Diagnosis not present

## 2023-06-30 DIAGNOSIS — S22089A Unspecified fracture of T11-T12 vertebra, initial encounter for closed fracture: Secondary | ICD-10-CM | POA: Insufficient documentation

## 2023-06-30 DIAGNOSIS — S61210A Laceration without foreign body of right index finger without damage to nail, initial encounter: Secondary | ICD-10-CM | POA: Diagnosis not present

## 2023-06-30 DIAGNOSIS — M7989 Other specified soft tissue disorders: Secondary | ICD-10-CM | POA: Diagnosis not present

## 2023-06-30 DIAGNOSIS — W208XXA Other cause of strike by thrown, projected or falling object, initial encounter: Secondary | ICD-10-CM | POA: Insufficient documentation

## 2023-06-30 DIAGNOSIS — M19041 Primary osteoarthritis, right hand: Secondary | ICD-10-CM | POA: Diagnosis not present

## 2023-06-30 MED ORDER — LIDOCAINE-EPINEPHRINE (PF) 2 %-1:200000 IJ SOLN
10.0000 mL | Freq: Once | INTRAMUSCULAR | Status: AC
  Administered 2023-06-30: 10 mL via INTRADERMAL
  Filled 2023-06-30: qty 20

## 2023-06-30 NOTE — ED Notes (Signed)
Reviewed AVS/discharge instruction with patient. Time allotted for and all questions answered. Patient is agreeable for d/c and escorted to ed exit by staff.  

## 2023-06-30 NOTE — Discharge Instructions (Signed)
Return for redness drainage or if you get a fever.  This is that replace should be removed between day 10 and 14.  This can be done here at urgent care or at your family doctor's office.  The area can get wet but not fully immersed underwater.  No scrubbing.  If you really want to clean it you can apply a half-and-half hydrogen peroxide solution with water on a Q-tip.  You can apply an ointment a couple times a day this could be as simple as Vaseline but could also be an antibiotic ointment if you wish.

## 2023-06-30 NOTE — ED Notes (Signed)
Patient transported to X-ray 

## 2023-06-30 NOTE — ED Triage Notes (Signed)
Cutting down a tree, about 6-8in around. [place] on patient. Hit patient chest/eg and fell. Near syncopal/syncopal. Injury to right pointer finger

## 2023-06-30 NOTE — ED Provider Notes (Signed)
[place] [facility]ERG[facility] DEPARTMENT AT [facility] Provider Note   CSN: [id number] Arrival date & time: 06/30/23  1251     History  No chief complaint on file.   Cathy Goodwin is a 67 y.o. female.  67 yo F with a cc of a fall.  Patient was helping with tree removal and had the tree hit her in the chest and fell onto her bottom.  Tree fell onto her thighs.  Pain mostly to her low back but she also suffered an cut to her right index finger.  She denies any pain to the chest or the abdomen.  Has been able to ambulate without issue.  Denies head injury denies neck pain.  She thinks that her tetanus shot is up-to-date.        Home Medications Prior to Admission medications   Medication Sig Start Date End Date Taking? Authorizing Provider  acetaminophen (TYLENOL) 500 MG tablet Take 1 tablet (500 mg total) by mouth every 6 (six) hours as needed for moderate pain. 06/26/22   Etta Grandchild, MD  ALPRAZolam Prudy Feeler) 0.5 MG tablet TAKE 1TAB BY MOUTH AT BEDTIME AS NEEDED FOR ANXIETY/SLEEP 04/13/23   Etta Grandchild, MD  atorvastatin (LIPITOR) 40 MG tablet Take 1 tablet (40 mg total) by mouth daily. 05/10/23   Etta Grandchild, MD  azelastine (ASTELIN) 0.1 % nasal spray Place 2 sprays into both nostrils daily as needed for allergies. 06/26/22   Etta Grandchild, MD  dicyclomine (BENTYL) 10 MG capsule Take 1 capsule (10 mg total) by mouth every 8 (eight) hours as needed for spasms. 10/30/22   Armbruster, Willaim Rayas, MD  famotidine (PEPCID) 40 MG tablet Take 1 tablet (40 mg total) by mouth daily. 05/09/23   Etta Grandchild, MD  fluticasone (FLONASE) 50 MCG/ACT nasal spray Place 1 spray into both nostrils 2 (two) times daily. Patient taking differently: Place 1 spray into both nostrils daily. 10/10/17   Remus Loffler, PA-C  gabapentin (NEURONTIN) 300 MG capsule Take 1 capsule (300 mg total) by mouth at bedtime. 05/09/23   Etta Grandchild, MD  methylcellulose (CITRUCEL) oral powder Take 1 packet by mouth  daily. 10/30/22   Armbruster, Willaim Rayas, MD  metoprolol tartrate (LOPRESSOR) 100 MG tablet Take 1 tablet (100 mg total) by mouth once for 1 dose. Take 90-120 minutes prior to scan. Hold for SBP less than 110. 06/18/23 06/18/23  O'Neal, Ronnald Ramp, MD  montelukast (SINGULAIR) 10 MG tablet Take 1 tablet by mouth daily as needed (allergies). 11/26/20   [provider]  olmesartan (BENICAR) 40 MG tablet Take 1 tablet (40 mg total) by mouth daily. 05/10/23   Etta Grandchild, MD  omeprazole (PRILOSEC OTC) 20 MG tablet Take 1 tablet (20 mg total) by mouth daily. 05/09/23   Etta Grandchild, MD  traMADol (ULTRAM-ER) 100 MG 24 hr tablet TAKE 1 TABLET (100 MG TOTAL) BY MOUTH DAILY AS NEEDED FOR PAIN. 04/13/23   Etta Grandchild, MD  Vitamin D, Ergocalciferol, (DRISDOL) 1.25 MG (50000 UNIT) CAPS capsule TAKE 1 CAPSULE (50,000 UNITS TOTAL) BY MOUTH EVERY 7 (SEVEN) DAYS 06/17/23   Etta Grandchild, MD  zolpidem (AMBIEN) 10 MG tablet TAKE 1/2 TO 1 TABLET BY MOUTH AT BEDTIME AS NEEDED FOR SLEEP. 06/07/23   Etta Grandchild, MD      Allergies    Patient has no known allergies.    Review of Systems   Review of Systems  Physical Exam Updated Vital Signs  BP 104/77 (BP Location: Right Arm)   Pulse 69   Temp (!) 97.5 F (36.4 C) (Oral)   Resp 20   SpO2 95%  Physical Exam Vitals and nursing note reviewed.  Constitutional:      General: She is not in acute distress.    Appearance: She is well-developed. She is not diaphoretic.  HENT:     Head: Normocephalic and atraumatic.  Eyes:     Pupils: Pupils are equal, round, and reactive to light.  Cardiovascular:     Rate and Rhythm: Normal rate and regular rhythm.     Heart sounds: No murmur heard.    No friction rub. No gallop.  Pulmonary:     Effort: Pulmonary effort is normal.     Breath sounds: No wheezing or rales.  Abdominal:     General: There is no distension.     Palpations: Abdomen is soft.     Tenderness: There is no abdominal tenderness.   Musculoskeletal:        General: No tenderness.     Cervical back: Normal range of motion and neck supple.     Comments: U-shaped laceration to the palmar aspects of the right first digit.  No obvious injury to the nail.  Range of motion intact.  Mild pain to the diffuse low back.  No obvious midline spinal tenderness step-offs or deformities.  Skin:    General: Skin is warm and dry.  Neurological:     Mental Status: She is alert and oriented to person, place, and time.  Psychiatric:        Behavior: Behavior normal.     ED Results / Procedures / Treatments   Labs (all labs ordered are listed, but only abnormal results are displayed) Labs Reviewed - No data to display  EKG None  Radiology DG Finger Index Right  Result Date: 06/30/2023 CLINICAL DATA:  Pointer finger injury sustained cutting down a tree. EXAM: RIGHT INDEX FINGER 2+V COMPARISON:  None Available. FINDINGS: There are moderate degenerative changes at the distal interphalangeal joint with joint space narrowing and fragmented osteophytes. No evidence of acute fracture or dislocation. The proximal interphalangeal and metacarpal phalangeal joint spaces are preserved. There is soft tissue swelling and irregularity distally suspicious for soft tissue injury. No foreign body or definite soft tissue emphysema allowing for the artifact associated with the overlying bandages. IMPRESSION: Soft tissue swelling and irregularity distally suspicious for soft tissue injury. No evidence of acute fracture or dislocation. Electronically Signed   By: Carey Bullocks M.D.   On: 06/30/2023 14:30   DG Lumbar Spine Complete  Result Date: 06/30/2023 CLINICAL DATA:  Back pain after a fall. EXAM: LUMBAR SPINE - COMPLETE 4+ VIEW COMPARISON:  05/02/2023 FINDINGS: No evidence for an acute fracture. Trace anterolisthesis of L4 on 5 is likely degenerative. SI joints unremarkable. 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 IMPRESSION: 1. Stable.  No acute bony abnormality. 2. Trace anterolisthesis of L4 on 5 is likely degenerative. 3. 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 Electronically Signed   By: Kennith Center M.D.   On: 06/30/2023 14:29    Procedures .Marland KitchenLaceration Repair  Date/Time: 06/30/2023 2:46 PM  Performed by: Melene Plan, DO Authorized by: Melene Plan, DO   Consent:    Consent obtained:  Verbal   Consent given by:  Patient   Risks, benefits,  and alternatives were discussed: yes     Risks discussed:  Infection, pain, poor cosmetic result and poor wound healing   Alternatives discussed:  No treatment Universal protocol:    Procedure explained and questions answered to patient or proxy's satisfaction: yes     Immediately prior to procedure, a time out was called: yes     Patient identity confirmed:  Verbally with patient Anesthesia:    Anesthesia method:  Nerve block   Block location:  Digital   Block needle gauge:  27 G   Block anesthetic:  Lidocaine 2% WITH epi   Block technique:  Digital   Block injection procedure:  Anatomic landmarks identified, anatomic landmarks palpated, introduced needle and negative aspiration for blood   Block outcome:  Incomplete block Laceration details:    Location:  Finger   Finger location:  R index finger   Length (cm):  8 Pre-procedure details:    Preparation:  Patient was prepped and draped in usual sterile fashion Exploration:    Hemostasis achieved with:  Epinephrine and direct pressure   Imaging obtained: x-ray     Imaging outcome: foreign body not noted     Wound exploration: wound explored through full range of motion and entire depth of wound visualized     Wound extent: no underlying fracture     Contaminated: no   Treatment:    Area cleansed with:  Chlorhexidine   Amount of cleaning:  Standard   Irrigation  solution:  Sterile saline   Irrigation volume:  200   Irrigation method:  Pressure wash   Visualized foreign bodies/material removed: no     Debridement:  None   Undermining:  None   Scar revision: no   Skin repair:    Repair method:  Sutures   Suture size:  5-0   Suture material:  Nylon   Suture technique:  Simple interrupted   Number of sutures:  7 Approximation:    Approximation:  Close Repair type:    Repair type:  Simple Post-procedure details:    Dressing:  Antibiotic ointment and adhesive bandage   Procedure completion:  Tolerated well, no immediate complications     Medications Ordered in ED Medications  lidocaine-EPINEPHrine (XYLOCAINE W/EPI) 2 %-1:200000 (PF) injection 10 mL (has no administration in time range)    ED Course/ Medical Decision Making/ A&P                                 Medical Decision Making Amount and/or Complexity of Data Reviewed Radiology: ordered.  Risk Prescription drug management.   67 yo F with a chief complaints of being struck by a tree branch.  The patient was helping with removal of a tree and when the chainsaw went through the tree suddenly hit her in the chest and she fell down.  Treated then landed on her lap.  It seems like she is complaining mostly of low back and right index finger pain.  I reviewed her medical record and her tetanus was updated in 2021.  Will obtain a plain film of the right index finger and low back.  Repair at bedside.  Reassess.  2:48 PM:  I have discussed the diagnosis/risks/treatment options with the patient.  Evaluation and diagnostic testing in the emergency department does not suggest an emergent condition requiring admission or immediate intervention beyond what has been performed at this time.  They will follow up with PCP.  We also discussed returning to the ED immediately if new or worsening sx occur. We discussed the sx which are most concerning (e.g., sudden worsening pain, fever, inability to  tolerate by mouth, redness, drainage) that necessitate immediate return. Medications administered to the patient during their visit and any new prescriptions provided to the patient are listed below.  Medications given during this visit Medications  lidocaine-EPINEPHrine (XYLOCAINE W/EPI) 2 %-1:200000 (PF) injection 10 mL (has no administration in time range)     The patient appears reasonably screen and/or stabilized for discharge and I doubt any other medical condition or other [facility] requiring further screening, evaluation, or treatment in the ED at this time prior to discharge.          Final Clinical Impression(s) / ED Diagnoses Final diagnoses:  Acute left-sided low back pain without sciatica  Laceration of right index finger without foreign body, nail damage status unspecified, initial encounter    Rx / DC Orders ED Discharge Orders     None         Melene Plan, DO 06/30/23 1448

## 2023-07-03 DIAGNOSIS — M546 Pain in thoracic spine: Secondary | ICD-10-CM | POA: Diagnosis not present

## 2023-07-03 DIAGNOSIS — M5459 Other low back pain: Secondary | ICD-10-CM | POA: Diagnosis not present

## 2023-07-03 DIAGNOSIS — M5451 Vertebrogenic low back pain: Secondary | ICD-10-CM | POA: Diagnosis not present

## 2023-07-07 DIAGNOSIS — M546 Pain in thoracic spine: Secondary | ICD-10-CM | POA: Diagnosis not present

## 2023-07-07 DIAGNOSIS — M5451 Vertebrogenic low back pain: Secondary | ICD-10-CM | POA: Diagnosis not present

## 2023-07-10 ENCOUNTER — Other Ambulatory Visit: Payer: Self-pay | Admitting: Internal Medicine

## 2023-07-10 DIAGNOSIS — K2100 Gastro-esophageal reflux disease with esophagitis, without bleeding: Secondary | ICD-10-CM

## 2023-07-10 DIAGNOSIS — I10 Essential (primary) hypertension: Secondary | ICD-10-CM

## 2023-07-10 MED ORDER — FAMOTIDINE 40 MG PO TABS
40.0000 mg | ORAL_TABLET | Freq: Every day | ORAL | 1 refills | Status: DC
Start: 2023-07-10 — End: 2023-12-13

## 2023-07-12 ENCOUNTER — Encounter: Payer: Self-pay | Admitting: Emergency Medicine

## 2023-07-12 ENCOUNTER — Ambulatory Visit: Payer: BC Managed Care – PPO | Admitting: Emergency Medicine

## 2023-07-12 VITALS — BP 124/88 | HR 71 | Temp 98.3°F | Ht 62.0 in | Wt 174.4 lb

## 2023-07-12 DIAGNOSIS — Z4802 Encounter for removal of sutures: Secondary | ICD-10-CM

## 2023-07-12 DIAGNOSIS — S61210A Laceration without foreign body of right index finger without damage to nail, initial encounter: Secondary | ICD-10-CM | POA: Insufficient documentation

## 2023-07-12 NOTE — Progress Notes (Signed)
Cathy Goodwin 67 y.o.   Chief Complaint  Patient presents with   Medical Management of Chronic Issues    Patient states she had cut her finger, and she thinks it is not healing good. Patient has stitches     HISTORY OF PRESENT ILLNESS: This is a 67 y.o. female sustained a fall 06/30/2023 during which she also sustained index finger laceration.  Sutures were placed in the emergency department.  Here for follow-up and possible removal.  HPI   Prior to Admission medications   Medication Sig Start Date End Date Taking? Authorizing Provider  acetaminophen (TYLENOL) 500 MG tablet Take 1 tablet (500 mg total) by mouth every 6 (six) hours as needed for moderate pain. 06/26/22  Yes Etta Grandchild, MD  ALPRAZolam Prudy Feeler) 0.5 MG tablet TAKE 1TAB BY MOUTH AT BEDTIME AS NEEDED FOR ANXIETY/SLEEP 04/13/23  Yes Etta Grandchild, MD  atorvastatin (LIPITOR) 40 MG tablet Take 1 tablet (40 mg total) by mouth daily. 05/10/23  Yes Etta Grandchild, MD  azelastine (ASTELIN) 0.1 % nasal spray Place 2 sprays into both nostrils daily as needed for allergies. 06/26/22  Yes Etta Grandchild, MD  dicyclomine (BENTYL) 10 MG capsule Take 1 capsule (10 mg total) by mouth every 8 (eight) hours as needed for spasms. 10/30/22  Yes Armbruster, Willaim Rayas, MD  famotidine (PEPCID) 40 MG tablet Take 1 tablet (40 mg total) by mouth daily. 07/10/23  Yes Etta Grandchild, MD  fluticasone (FLONASE) 50 MCG/ACT nasal spray Place 1 spray into both nostrils 2 (two) times daily. 10/10/17  Yes Remus Loffler, PA-C  gabapentin (NEURONTIN) 300 MG capsule Take 1 capsule (300 mg total) by mouth at bedtime. 05/09/23  Yes Etta Grandchild, MD  methylcellulose (CITRUCEL) oral powder Take 1 packet by mouth daily. 10/30/22  Yes Armbruster, Willaim Rayas, MD  montelukast (SINGULAIR) 10 MG tablet Take 1 tablet by mouth daily as needed (allergies). 11/26/20  Yes [provider]  olmesartan (BENICAR) 40 MG tablet Take 1 tablet (40 mg total) by mouth daily. 05/10/23   Yes Etta Grandchild, MD  omeprazole (PRILOSEC OTC) 20 MG tablet Take 1 tablet (20 mg total) by mouth daily. 05/09/23  Yes Etta Grandchild, MD  traMADol (ULTRAM-ER) 100 MG 24 hr tablet TAKE 1 TABLET (100 MG TOTAL) BY MOUTH DAILY AS NEEDED FOR PAIN. 04/13/23  Yes Etta Grandchild, MD  Vitamin D, Ergocalciferol, (DRISDOL) 1.25 MG (50000 UNIT) CAPS capsule TAKE 1 CAPSULE (50,000 UNITS TOTAL) BY MOUTH EVERY 7 (SEVEN) DAYS 06/17/23  Yes Etta Grandchild, MD  zolpidem (AMBIEN) 10 MG tablet TAKE 1/2 TO 1 TABLET BY MOUTH AT BEDTIME AS NEEDED FOR SLEEP. 06/07/23  Yes Etta Grandchild, MD  metoprolol tartrate (LOPRESSOR) 100 MG tablet Take 1 tablet (100 mg total) by mouth once for 1 dose. Take 90-120 minutes prior to scan. Hold for SBP less than 110. 06/18/23 06/18/23  O'Neal, Ronnald Ramp, MD    No Known Allergies  Patient Active Problem List   Diagnosis Date Noted   Agatston CAC score 200-399 06/12/2023   Abnormal EKG 05/11/2023   Dyslipidemia, goal LDL below 100 05/09/2023   Prediabetes 05/09/2023   Vitamin D deficiency disease 05/09/2023   Former cigarette smoker 11/15/2022   Chronic maxillary sinusitis 11/15/2022   Need for vaccination 11/15/2022   Estrogen deficiency 11/15/2022   Acute non-recurrent maxillary sinusitis 09/14/2022   Encounter for general adult medical examination with abnormal findings 03/13/2022   NASH (nonalcoholic steatohepatitis) 03/13/2022  GAD (generalized anxiety disorder) 03/13/2022   Psychophysiological insomnia 03/13/2022   Esophageal dysphagia 03/13/2022   Primary osteoarthritis of both hips 03/13/2022   Spinal stenosis of lumbar region without neurogenic claudication 03/13/2022   Visit for screening mammogram 03/13/2022   Primary hypertension 03/13/2022   BMI 29.0-29.9,adult 04/12/2016   Restless leg 07/14/2014   Acid reflux 07/14/2014    Past Medical History:  Diagnosis Date   Anxiety    Chronic pansinusitis    Deviated septum    Diverticulosis    Family  history of colon cancer in father    GERD (gastroesophageal reflux disease)    History of ductal carcinoma in situ (DCIS) of breast    Hypertension    Personal history of radiation therapy    RLS (restless legs syndrome)    Spinal stenosis of lumbar region without neurogenic claudication     Past Surgical History:  Procedure Laterality Date   BREAST LUMPECTOMY Left 07/2019   WISDOM TOOTH EXTRACTION      Social History   Socioeconomic History   Marital status: Legally Separated    Spouse name: Not on file   Number of children: 0   Years of education: Not on file   Highest education level: Associate degree: academic program  Occupational History   Occupation: CSR  Tobacco Use   Smoking status: Every Day    Current packs/day: 0.50    Average packs/day: 0.5 packs/day for 20.0 years (10.0 ttl pk-yrs)    Types: Cigarettes   Smokeless tobacco: Never  Vaping Use   Vaping status: Never Used  Substance and Sexual Activity   Alcohol use: Yes    Alcohol/week: 1.0 standard drink of alcohol    Types: 1 Shots of liquor per week    Comment: occ   Drug use: No   Sexual activity: Yes    Partners: Male  Other Topics Concern   Not on file  Social History Narrative   Not on file   Social Determinants of Health   Financial Resource Strain: Patient Declined (05/05/2023)   Overall Financial Resource Strain (CARDIA)    Difficulty of Paying Living Expenses: Patient declined  Food Insecurity: Patient Declined (05/05/2023)   Hunger Vital Sign    Worried About Running Out of Food in the Last Year: Patient declined    Ran Out of Food in the Last Year: Patient declined  Transportation Needs: No Transportation Needs (05/05/2023)   PRAPARE - Administrator, Civil Service (Medical): No    Lack of Transportation (Non-Medical): No  Physical Activity: Unknown (05/05/2023)   Exercise Vital Sign    Days of Exercise per Week: 0 days    Minutes of Exercise per Session: Not on file  Stress:  Stress Concern Present (05/05/2023)   [employer] of Occupational Health - Occupational Stress Questionnaire    Feeling of Stress : Very much  Social Connections: Unknown (05/05/2023)   Social Connection and Isolation Panel [NHANES]    Frequency of Communication with Friends and Family: More than three times a week    Frequency of Social Gatherings with Friends and Family: More than three times a week    Attends Religious Services: Patient declined    Database administrator or Organizations: No    Attends Engineer, structural: Not on file    Marital Status: Separated  Intimate Partner Violence: Not on file    Family History  Problem Relation Age of Onset   Heart disease Mother  Cancer Father        colon, prostate   Heart disease Father    Hypertension Sister    Stomach cancer Neg Hx    Esophageal cancer Neg Hx      Review of Systems  Constitutional: Negative.  Negative for chills and fever.  HENT:  Negative for congestion.   Respiratory:  Negative for cough.   Gastrointestinal:  Negative for nausea and vomiting.  Skin: Negative.  Negative for rash.  Neurological:  Negative for dizziness and headaches.  All other systems reviewed and are negative.   Vitals:   07/12/23 1447  BP: 124/88  Pulse: 71  Temp: 98.3 F (36.8 C)  SpO2: 94%    Physical Exam Vitals reviewed.  Constitutional:      Appearance: Normal appearance.  HENT:     Head: Normocephalic.  Eyes:     Extraocular Movements: Extraocular movements intact.  Cardiovascular:     Rate and Rhythm: Normal rate.  Pulmonary:     Effort: Pulmonary effort is normal.  Skin:    General: Skin is warm and dry.     Comments: Right index finger: Well-healing laceration without infection.  8 sutures in place starting to create inflammation.  Have been in place for 12 days.  Neurological:     Mental Status: She is alert and oriented to person, place, and time.  Psychiatric:        Mood and Affect: Mood  normal.        Behavior: Behavior normal.    Procedure note: After obtaining verbal consent from patient, sutures were removed without bleeding or complications.  Area was cleansed with hydrogen peroxide and Steri-Strips were applied after applying tincture of benzoin to the skin.  ASSESSMENT & PLAN: Problem List Items Addressed This Visit       Other   Laceration of right index finger without foreign body without damage to nail - Primary    Well-healing laceration without complications or infection Sutures in place.  Removed today without difficulty Steri-Strips applied. Post suture removal care instructions given.      Other Visit Diagnoses     Visit for suture removal          Patient Instructions  Suture Removal, Care After The following information offers guidance on how to care for yourself after your procedure. Your health care provider may also give you more specific instructions. If you have problems or questions, contact your health care provider. What can I expect after the procedure? After your stitches (sutures) are removed, it is common to have: Some discomfort and swelling in the area. Slight redness in the area. Follow these instructions at home: If you have a dressing: Wash your hands with soap and water for at least 20 seconds before and after you change your bandage (dressing). If soap and water are not available, use hand sanitizer. Change your dressing as told by your health care provider. If your dressing becomes wet or dirty, or develops a bad smell, change it as soon as possible. If your dressing sticks to your skin, pour warm, clean water over it until it loosens and can be removed without pulling apart the wound edges. Pat the area dry with a soft, clean towel. Do not rub the wound because that may cause bleeding. Wound care  Check your wound every day for signs of infection. Check for: More redness, swelling, or pain. Fluid or blood. New warmth, a  rash, or hardness at the wound site. Pus  or a bad smell. Wash your hands with soap and water for at least 20 seconds before and after touching your wound. If soap and water are not available, use hand sanitizer. Keep the wound area dry and clean. Clean and pat the wound dry as told by your health care provider. Apply cream or ointment only as told by your health care provider. If skin glue or adhesive strips were applied after sutures were removed, leave these closures in place. They may need to stay in place for 2 weeks or longer. If adhesive strip edges start to loosen and curl up, you may trim the loose edges. Do not remove adhesive strips completely unless your health care provider tells you to do that. Continue to protect the wound from injury. Do not pick at your wound. Picking can cause an infection. Bathing Do not take baths, swim, or use a hot tub until your health care provider approves. Ask your health care provider if you may take showers. Follow these steps for showering: If you have a dressing, remove it before getting into the shower. In the shower, allow soapy water to get on the wound. Avoid scrubbing the wound. When you get out of the shower, dry the wound by patting it with a clean towel. Reapply a dressing over the wound, if needed. Scar care When your wound has completely healed, help decrease the size of your scar by: Wearing sunscreen over the scar or covering it with clothing when you are outside. New scars get sunburned easily, which can make scarring worse. Gently massaging the scarred area. This can decrease scar thickness. General instructions Take over-the-counter and prescription medicines only as told by your health care provider. Keep all follow-up visits. This is important. Contact a health care provider if: You have more redness, swelling, or pain around your wound. You have fluid or blood coming from your wound. You have new warmth, a rash, or hardness at  the wound site. You have pus or a bad smell coming from your wound. Your wound opens up. Get help right away if: You have a fever or chills. You have red streaks coming from your wound. Summary After your sutures are removed, it is common to have some discomfort and swelling in the area. Wash your hands with soap and water before you change your bandage (dressing). Keep the wound area dry and clean. Do not take baths, swim, or use a hot tub until your health care provider approves. This information is not intended to replace advice given to you by your health care provider. Make sure you discuss any questions you have with your health care provider. Document Revised: 01/11/2021 Document Reviewed: 01/11/2021 Elsevier Patient Education  2024 [employer]     Edwina Barth, MD [facility]

## 2023-07-12 NOTE — Patient Instructions (Signed)
Suture Removal, Care After The following information offers guidance on how to care for yourself after your procedure. Your health care provider may also give you more specific instructions. If you have problems or questions, contact your health care provider. What can I expect after the procedure? After your stitches (sutures) are removed, it is common to have: Some discomfort and swelling in the area. Slight redness in the area. Follow these instructions at home: If you have a dressing: Wash your hands with soap and water for at least 20 seconds before and after you change your bandage (dressing). If soap and water are not available, use hand sanitizer. Change your dressing as told by your health care provider. If your dressing becomes wet or dirty, or develops a bad smell, change it as soon as possible. If your dressing sticks to your skin, pour warm, clean water over it until it loosens and can be removed without pulling apart the wound edges. Pat the area dry with a soft, clean towel. Do not rub the wound because that may cause bleeding. Wound care  Check your wound every day for signs of infection. Check for: More redness, swelling, or pain. Fluid or blood. New warmth, a rash, or hardness at the wound site. Pus or a bad smell. Wash your hands with soap and water for at least 20 seconds before and after touching your wound. If soap and water are not available, use hand sanitizer. Keep the wound area dry and clean. Clean and pat the wound dry as told by your health care provider. Apply cream or ointment only as told by your health care provider. If skin glue or adhesive strips were applied after sutures were removed, leave these closures in place. They may need to stay in place for 2 weeks or longer. If adhesive strip edges start to loosen and curl up, you may trim the loose edges. Do not remove adhesive strips completely unless your health care provider tells you to do that. Continue to  protect the wound from injury. Do not pick at your wound. Picking can cause an infection. Bathing Do not take baths, swim, or use a hot tub until your health care provider approves. Ask your health care provider if you may take showers. Follow these steps for showering: If you have a dressing, remove it before getting into the shower. In the shower, allow soapy water to get on the wound. Avoid scrubbing the wound. When you get out of the shower, dry the wound by patting it with a clean towel. Reapply a dressing over the wound, if needed. Scar care When your wound has completely healed, help decrease the size of your scar by: Wearing sunscreen over the scar or covering it with clothing when you are outside. New scars get sunburned easily, which can make scarring worse. Gently massaging the scarred area. This can decrease scar thickness. General instructions Take over-the-counter and prescription medicines only as told by your health care provider. Keep all follow-up visits. This is important. Contact a health care provider if: You have more redness, swelling, or pain around your wound. You have fluid or blood coming from your wound. You have new warmth, a rash, or hardness at the wound site. You have pus or a bad smell coming from your wound. Your wound opens up. Get help right away if: You have a fever or chills. You have red streaks coming from your wound. Summary After your sutures are removed, it is common to have some discomfort  and swelling in the area. Wash your hands with soap and water before you change your bandage (dressing). Keep the wound area dry and clean. Do not take baths, swim, or use a hot tub until your health care provider approves. This information is not intended to replace advice given to you by your health care provider. Make sure you discuss any questions you have with your health care provider. Document Revised: 01/11/2021 Document Reviewed:  01/11/2021 Elsevier Patient Education  2024 [employer].

## 2023-07-12 NOTE — Assessment & Plan Note (Signed)
Well-healing laceration without complications or infection Sutures in place.  Removed today without difficulty Steri-Strips applied. Post suture removal care instructions given.

## 2023-07-15 ENCOUNTER — Other Ambulatory Visit: Payer: Self-pay | Admitting: Internal Medicine

## 2023-07-15 DIAGNOSIS — G2581 Restless legs syndrome: Secondary | ICD-10-CM

## 2023-07-15 DIAGNOSIS — F411 Generalized anxiety disorder: Secondary | ICD-10-CM

## 2023-07-15 DIAGNOSIS — F5104 Psychophysiologic insomnia: Secondary | ICD-10-CM

## 2023-07-23 NOTE — Progress Notes (Unsigned)
Cardiology Office Note   Date:  07/24/2023   ID:  Goodwin, Cathy 1956-09-24, MRN [id number]  PCP:  Cathy Grandchild, MD  Cardiologist:   None Referring:  Cathy Grandchild, MD  Chief Complaint  Patient presents with   Coronary Artery Disease      History of Present Illness: Cathy Goodwin is a 67 y.o. female who presents for evaluation of elevated coronary calcium.  She was found to have this in Sept.  She had non obstructive CAD.    This started because she had an abnormal EKG suggestive of an anteroseptal MI.  She has not had any prior cardiac history.  She has not had any chest pressure, neck or arm discomfort.  She does not have any palpitations, presyncope or syncope.  She has not shortness of breath.  She has had no weight gain or edema.  She is not particularly active though she does chores around the house like vacuuming.  This does not cause overt symptoms.  She has a hill in her yard up which she has to pull the garbage cans.  She might get a little winded with this.  Of note she did have an echocardiogram as well which demonstrated no significant abnormality.  There was no suggestion of a previous anteroseptal infarct.   Past Medical History:  Diagnosis Date   Anxiety    Chronic pansinusitis    Deviated septum    Diverticulosis    Family history of colon cancer in father    GERD (gastroesophageal reflux disease)    History of ductal carcinoma in situ (DCIS) of breast    Hypertension    Personal history of radiation therapy    RLS (restless legs syndrome)    Spinal stenosis of lumbar region without neurogenic claudication     Past Surgical History:  Procedure Laterality Date   BREAST LUMPECTOMY Left 07/2019   WISDOM TOOTH EXTRACTION       Current Outpatient Medications  Medication Sig Dispense Refill   acetaminophen (TYLENOL) 500 MG tablet Take 1 tablet (500 mg total) by mouth every 6 (six) hours as needed for moderate pain. 30 tablet 1   ALPRAZolam  (XANAX) 0.5 MG tablet TAKE 1 TABLET BY MOUTH AT BEDTIME AS NEEDED FOR ANXIETY/SLEEP 90 tablet 0   atorvastatin (LIPITOR) 40 MG tablet Take 1 tablet (40 mg total) by mouth daily. 90 tablet 1   azelastine (ASTELIN) 0.1 % nasal spray Place 2 sprays into both nostrils daily as needed for allergies. 90 mL 1   dicyclomine (BENTYL) 10 MG capsule Take 1 capsule (10 mg total) by mouth every 8 (eight) hours as needed for spasms. 30 capsule 1   famotidine (PEPCID) 40 MG tablet Take 1 tablet (40 mg total) by mouth daily. 90 tablet 1   fluticasone (FLONASE) 50 MCG/ACT nasal spray Place 1 spray into both nostrils 2 (two) times daily. 16 g 2   gabapentin (NEURONTIN) 300 MG capsule Take 1 capsule (300 mg total) by mouth at bedtime. 90 capsule 1   methylcellulose (CITRUCEL) oral powder Take 1 packet by mouth daily.     montelukast (SINGULAIR) 10 MG tablet Take 1 tablet by mouth daily as needed (allergies).     olmesartan (BENICAR) 40 MG tablet Take 1 tablet (40 mg total) by mouth daily. 90 tablet 1   omeprazole (PRILOSEC OTC) 20 MG tablet Take 1 tablet (20 mg total) by mouth daily. 90 tablet 1   traMADol (ULTRAM-ER) 100 MG 24 hr  tablet TAKE 1 TABLET (100 MG TOTAL) BY MOUTH DAILY AS NEEDED FOR PAIN. 90 tablet 0   Vitamin D, Ergocalciferol, (DRISDOL) 1.25 MG (50000 UNIT) CAPS capsule TAKE 1 CAPSULE (50,000 UNITS TOTAL) BY MOUTH EVERY 7 (SEVEN) DAYS 12 capsule 0   zolpidem (AMBIEN) 10 MG tablet TAKE 1/2 TO 1 TABLET BY MOUTH AT BEDTIME AS NEEDED FOR SLEEP. 30 tablet 1   metoprolol tartrate (LOPRESSOR) 100 MG tablet Take 1 tablet (100 mg total) by mouth once for 1 dose. Take 90-120 minutes prior to scan. Hold for SBP less than 110. 1 tablet 0   No current facility-administered medications for this visit.    Allergies:   Patient has no known allergies.    Social History:  The patient  reports that she has been smoking cigarettes. She has a 10 pack-year smoking history. She has never used smokeless tobacco. She  reports current alcohol use of about 1.0 standard drink of alcohol per week. She reports that she does not use drugs.   Family History:  The patient's family history includes Cancer in her father; Heart disease in her mother; Heart disease (age of onset: 37) in her father; Hypertension in her sister.    ROS:  Please see the history of present illness.   Otherwise, review of systems are positive for none.   All other systems are reviewed and negative.    PHYSICAL EXAM: VS:  BP 128/82   Pulse 77   Ht 5\' 2"  (1.575 m)   Wt 173 lb (78.5 kg)   SpO2 98%   BMI 31.64 kg/m  , BMI Body mass index is 31.64 kg/m. GENERAL:  Well appearing HEENT:  Pupils equal round and reactive, fundi not visualized, oral mucosa unremarkable NECK:  No jugular venous distention, waveform within normal limits, carotid upstroke brisk and symmetric, no bruits, no thyromegaly LYMPHATICS:  No cervical, inguinal adenopathy LUNGS:  Clear to auscultation bilaterally BACK:  No CVA tenderness CHEST:  Unremarkable HEART:  PMI not displaced or sustained,S1 and S2 within normal limits, no S3, no S4, no clicks, no rubs, no murmurs ABD:  Flat, positive bowel sounds normal in frequency in pitch, no bruits, no rebound, no guarding, no midline pulsatile mass, no hepatomegaly, no splenomegaly EXT:  2 plus pulses throughout, no edema, no cyanosis no clubbing SKIN:  No rashes no nodules NEURO:  Cranial nerves II through XII grossly intact, motor grossly intact throughout [facility]:  Cognitively intact, oriented to person place and time    EKG: Sinus rhythm, rate 82, axis within normal limits, intervals within normal limits, no acute ST-T wave changes, 05/09/2023    Recent Labs: 11/15/2022: Hemoglobin 14.9; Platelets 306.0 05/09/2023: ALT 15; BUN 16; Creatinine, Ser 0.79; Potassium 4.9; Sodium 139    Lipid Panel    Component Value Date/Time   CHOL 213 (H) 05/09/2023 1702   TRIG 102.0 05/09/2023 1702   HDL 62.80 05/09/2023 1702    CHOLHDL 3 05/09/2023 1702   VLDL 20.4 05/09/2023 1702   LDLCALC 130 (H) 05/09/2023 1702      Wt Readings from Last 3 Encounters:  07/24/23 173 lb (78.5 kg)  07/12/23 174 lb 6 oz (79.1 kg)  05/09/23 172 lb (78 kg)      Other studies Reviewed: Additional studies/ records that were reviewed today include: Office records, CT and echo Review of the above records demonstrates:  Please see elsewhere in the note.     ASSESSMENT AND PLAN:  Elevated coronary calcium/CAD:  The patient has  nonobstructive coronary disease but no symptoms.  We discussed at length risk reduction.  No further testing is indicated  Dyslipidemia: Her LDL was 130.  She has been started on statin since then and she said she had some screening testing done just a month and a half later and it was in the 80s at work.  I think the goal should be in the 70s.  We talked about a Mediterranean plant-based diet.  Risk reduction: I gave her prescription for exercise.   Current medicines are reviewed at length with the patient today.  The patient does not have concerns regarding medicines.  The following changes have been made:  no change  Labs/ tests ordered today include: None No orders of the defined types were placed in this encounter.    Disposition:   FU with me as needed.     Signed, Rollene Rotunda, MD  07/24/2023 4:16 PM    [facility]

## 2023-07-24 ENCOUNTER — Encounter: Payer: Self-pay | Admitting: Cardiology

## 2023-07-24 ENCOUNTER — Ambulatory Visit: Payer: BC Managed Care – PPO | Attending: Cardiology | Admitting: Cardiology

## 2023-07-24 VITALS — BP 128/82 | HR 77 | Ht 62.0 in | Wt 173.0 lb

## 2023-07-24 DIAGNOSIS — R931 Abnormal findings on diagnostic imaging of heart and coronary circulation: Secondary | ICD-10-CM | POA: Diagnosis not present

## 2023-07-24 NOTE — Patient Instructions (Signed)
    Follow-Up: At [facility], you and your health needs are our priority.  As part of our continuing mission to provide you with exceptional heart care, we have created designated Provider Care Teams.  These Care Teams include your primary Cardiologist (physician) and Advanced Practice Providers (APPs -  Physician Assistants and Nurse Practitioners) who all work together to provide you with the care you need, when you need it.  We recommend signing up for the patient portal called "MyChart".  Sign up information is provided on this After Visit Summary.  MyChart is used to connect with patients for Virtual Visits (Telemedicine).  Patients are able to view lab/test results, encounter notes, upcoming appointments, etc.  Non-urgent messages can be sent to your provider as well.   To learn more about what you can do with MyChart, go to [email or web address].    Your next appointment:   As Needed.  Provider:    Rollene Rotunda, MD  Other Instructions

## 2023-08-01 DIAGNOSIS — M546 Pain in thoracic spine: Secondary | ICD-10-CM | POA: Diagnosis not present

## 2023-08-10 ENCOUNTER — Other Ambulatory Visit: Payer: Self-pay | Admitting: Internal Medicine

## 2023-08-10 DIAGNOSIS — M48061 Spinal stenosis, lumbar region without neurogenic claudication: Secondary | ICD-10-CM

## 2023-08-10 DIAGNOSIS — G2581 Restless legs syndrome: Secondary | ICD-10-CM

## 2023-08-10 DIAGNOSIS — M160 Bilateral primary osteoarthritis of hip: Secondary | ICD-10-CM

## 2023-08-14 ENCOUNTER — Other Ambulatory Visit: Payer: Self-pay | Admitting: Internal Medicine

## 2023-08-14 DIAGNOSIS — F5104 Psychophysiologic insomnia: Secondary | ICD-10-CM

## 2023-08-14 DIAGNOSIS — G2581 Restless legs syndrome: Secondary | ICD-10-CM

## 2023-09-11 DIAGNOSIS — M546 Pain in thoracic spine: Secondary | ICD-10-CM | POA: Diagnosis not present

## 2023-10-14 ENCOUNTER — Other Ambulatory Visit: Payer: Self-pay | Admitting: Internal Medicine

## 2023-10-14 DIAGNOSIS — F5104 Psychophysiologic insomnia: Secondary | ICD-10-CM

## 2023-10-14 DIAGNOSIS — G2581 Restless legs syndrome: Secondary | ICD-10-CM

## 2023-10-14 DIAGNOSIS — F411 Generalized anxiety disorder: Secondary | ICD-10-CM

## 2023-10-24 ENCOUNTER — Other Ambulatory Visit: Payer: Self-pay | Admitting: Internal Medicine

## 2023-10-24 DIAGNOSIS — E559 Vitamin D deficiency, unspecified: Secondary | ICD-10-CM

## 2023-10-24 DIAGNOSIS — I10 Essential (primary) hypertension: Secondary | ICD-10-CM

## 2023-10-24 DIAGNOSIS — M48061 Spinal stenosis, lumbar region without neurogenic claudication: Secondary | ICD-10-CM

## 2023-10-30 ENCOUNTER — Other Ambulatory Visit: Payer: Self-pay | Admitting: Internal Medicine

## 2023-10-30 DIAGNOSIS — E785 Hyperlipidemia, unspecified: Secondary | ICD-10-CM

## 2023-11-08 DIAGNOSIS — M5451 Vertebrogenic low back pain: Secondary | ICD-10-CM | POA: Diagnosis not present

## 2023-11-10 DIAGNOSIS — M5416 Radiculopathy, lumbar region: Secondary | ICD-10-CM | POA: Insufficient documentation

## 2023-11-23 ENCOUNTER — Other Ambulatory Visit: Payer: Self-pay | Admitting: Internal Medicine

## 2023-11-23 DIAGNOSIS — M48061 Spinal stenosis, lumbar region without neurogenic claudication: Secondary | ICD-10-CM

## 2023-11-23 DIAGNOSIS — M160 Bilateral primary osteoarthritis of hip: Secondary | ICD-10-CM

## 2023-11-23 DIAGNOSIS — G2581 Restless legs syndrome: Secondary | ICD-10-CM

## 2023-12-11 ENCOUNTER — Ambulatory Visit: Payer: BC Managed Care – PPO | Admitting: Internal Medicine

## 2023-12-11 ENCOUNTER — Encounter: Payer: Self-pay | Admitting: Internal Medicine

## 2023-12-11 ENCOUNTER — Other Ambulatory Visit: Payer: Self-pay | Admitting: Internal Medicine

## 2023-12-11 VITALS — BP 118/68 | HR 83 | Temp 98.1°F | Resp 16 | Ht 62.0 in | Wt 174.2 lb

## 2023-12-11 DIAGNOSIS — R7303 Prediabetes: Secondary | ICD-10-CM | POA: Diagnosis not present

## 2023-12-11 DIAGNOSIS — G2581 Restless legs syndrome: Secondary | ICD-10-CM

## 2023-12-11 DIAGNOSIS — E785 Hyperlipidemia, unspecified: Secondary | ICD-10-CM

## 2023-12-11 DIAGNOSIS — I10 Essential (primary) hypertension: Secondary | ICD-10-CM | POA: Diagnosis not present

## 2023-12-11 DIAGNOSIS — F5104 Psychophysiologic insomnia: Secondary | ICD-10-CM

## 2023-12-11 DIAGNOSIS — M5416 Radiculopathy, lumbar region: Secondary | ICD-10-CM | POA: Diagnosis not present

## 2023-12-11 DIAGNOSIS — K2100 Gastro-esophageal reflux disease with esophagitis, without bleeding: Secondary | ICD-10-CM

## 2023-12-11 LAB — HEPATIC FUNCTION PANEL
ALT: 17 U/L (ref 0–35)
AST: 21 U/L (ref 0–37)
Albumin: 4.6 g/dL (ref 3.5–5.2)
Alkaline Phosphatase: 93 U/L (ref 39–117)
Bilirubin, Direct: 0.1 mg/dL (ref 0.0–0.3)
Total Bilirubin: 0.7 mg/dL (ref 0.2–1.2)
Total Protein: 7.5 g/dL (ref 6.0–8.3)

## 2023-12-11 LAB — CBC WITH DIFFERENTIAL/PLATELET
Basophils Absolute: 0 10*3/uL (ref 0.0–0.1)
Basophils Relative: 0.2 % (ref 0.0–3.0)
Eosinophils Absolute: 0 10*3/uL (ref 0.0–0.7)
Eosinophils Relative: 0.1 % (ref 0.0–5.0)
HCT: 42.5 % (ref 36.0–46.0)
Hemoglobin: 14 g/dL (ref 12.0–15.0)
Lymphocytes Relative: 11.2 % — ABNORMAL LOW (ref 12.0–46.0)
Lymphs Abs: 0.7 10*3/uL (ref 0.7–4.0)
MCHC: 33 g/dL (ref 30.0–36.0)
MCV: 94.7 fl (ref 78.0–100.0)
Monocytes Absolute: 0.1 10*3/uL (ref 0.1–1.0)
Monocytes Relative: 1.6 % — ABNORMAL LOW (ref 3.0–12.0)
Neutro Abs: 5.4 10*3/uL (ref 1.4–7.7)
Neutrophils Relative %: 86.9 % — ABNORMAL HIGH (ref 43.0–77.0)
Platelets: 293 10*3/uL (ref 150.0–400.0)
RBC: 4.48 Mil/uL (ref 3.87–5.11)
RDW: 14 % (ref 11.5–15.5)
WBC: 6.2 10*3/uL (ref 4.0–10.5)

## 2023-12-11 LAB — BASIC METABOLIC PANEL
BUN: 17 mg/dL (ref 6–23)
CO2: 26 meq/L (ref 19–32)
Calcium: 9.8 mg/dL (ref 8.4–10.5)
Chloride: 104 meq/L (ref 96–112)
Creatinine, Ser: 0.85 mg/dL (ref 0.40–1.20)
GFR: 71.49 mL/min (ref >60.00)
Glucose, Bld: 140 mg/dL — ABNORMAL HIGH (ref 70–99)
Potassium: 4.4 meq/L (ref 3.5–5.1)
Sodium: 139 meq/L (ref 135–145)

## 2023-12-11 LAB — HEMOGLOBIN A1C: Hgb A1c MFr Bld: 6.1 % (ref 4.6–6.5)

## 2023-12-11 LAB — TSH: TSH: 0.65 u[IU]/mL (ref 0.35–5.50)

## 2023-12-11 MED ORDER — PITAVASTATIN CALCIUM 2 MG PO TABS
2.0000 mg | ORAL_TABLET | Freq: Every day | ORAL | 1 refills | Status: DC
Start: 2023-12-11 — End: 2024-05-19

## 2023-12-11 NOTE — Progress Notes (Unsigned)
 Subjective:  Patient ID: Cathy Goodwin, female    DOB: 07-19-1956  Age: 68 y.o. MRN: [id number]  CC: Hypertension and Hyperlipidemia   HPI Cathy Goodwin presents for f/up ----  Discussed the use of AI scribe software for clinical note transcription with the patient, who gave verbal consent to proceed.  History of Present Illness   The patient presents with hip pain and restless legs syndrome. She recently had a nerve block to address hip pain.  She experiences hip pain attributed to osteoarthritis and a pinched nerve. A recent nerve block targeting the L5 nerve was performed at [facility]. The pain affects both sides, starting from the bottom of the buttocks and radiating down the backs of her legs. The area is currently sore following the procedure.  She suffers from restless legs syndrome, which worsens at night and disrupts her sleep. The restless sensation extends beyond her legs to her ankles, neck, and arms, causing significant discomfort. Gabapentin provides some relief, but tramadol is less effective. She occasionally uses zolpidem as needed to help with sleep.  She experiences occasional constipation, which she manages with magnesium and stool softeners. No chest pain or shortness of breath is reported, but she mentions dizziness and burning eyes, which she attributes to sinus issues and allergies.  Her blood pressure was noted to be low at 118/68, but she does not feel weak, dizzy, or lightheaded. She attributes mild dizziness to sinus issues and allergies.       Outpatient Medications Prior to Visit  Medication Sig Dispense Refill   acetaminophen (TYLENOL) 500 MG tablet Take 1 tablet (500 mg total) by mouth every 6 (six) hours as needed for moderate pain. 30 tablet 1   ALPRAZolam (XANAX) 0.5 MG tablet TAKE 1 TABLET BY MOUTH AT BEDTIME AS NEEDED FOR ANXIETY OR SLEEP 90 tablet 0   azelastine (ASTELIN) 0.1 % nasal spray Place 2 sprays into both nostrils daily as needed for  allergies. 90 mL 1   famotidine (PEPCID) 40 MG tablet Take 1 tablet (40 mg total) by mouth daily. 90 tablet 1   fluticasone (FLONASE) 50 MCG/ACT nasal spray Place 1 spray into both nostrils 2 (two) times daily. 16 g 2   gabapentin (NEURONTIN) 300 MG capsule TAKE 1 CAPSULE BY MOUTH EVERYDAY AT BEDTIME 90 capsule 1   methylcellulose (CITRUCEL) oral powder Take 1 packet by mouth daily.     montelukast (SINGULAIR) 10 MG tablet Take 1 tablet by mouth daily as needed (allergies).     olmesartan (BENICAR) 40 MG tablet TAKE 1 TABLET BY MOUTH EVERY DAY 90 tablet 0   omeprazole (PRILOSEC OTC) 20 MG tablet Take 1 tablet (20 mg total) by mouth daily. 90 tablet 1   traMADol (ULTRAM-ER) 100 MG 24 hr tablet TAKE 1 TABLET (100 MG TOTAL) BY MOUTH DAILY AS NEEDED FOR PAIN. 90 tablet 0   Vitamin D, Ergocalciferol, (DRISDOL) 1.25 MG (50000 UNIT) CAPS capsule TAKE 1 CAPSULE (50,000 UNITS TOTAL) BY MOUTH EVERY 7 (SEVEN) DAYS 12 capsule 0   zolpidem (AMBIEN) 10 MG tablet TAKE 1/2 TO 1 TABLET BY MOUTH AT BEDTIME AS NEEDED FOR SLEEP 30 tablet 1   dicyclomine (BENTYL) 10 MG capsule Take 1 capsule (10 mg total) by mouth every 8 (eight) hours as needed for spasms. (Patient not taking: Reported on 12/11/2023) 30 capsule 1   atorvastatin (LIPITOR) 40 MG tablet TAKE 1 TABLET BY MOUTH EVERY DAY (Patient not taking: Reported on 12/11/2023) 90 tablet 1   metoprolol tartrate (  LOPRESSOR) 100 MG tablet Take 1 tablet (100 mg total) by mouth once for 1 dose. Take 90-120 minutes prior to scan. Hold for SBP less than 110. 1 tablet 0   No facility-administered medications prior to visit.    ROS Review of Systems  Gastrointestinal:  Positive for constipation. Negative for abdominal pain.  Neurological: Negative.   Hematological:  Negative for adenopathy. Does not bruise/bleed easily.  Psychiatric/Behavioral:  Positive for sleep disturbance. Negative for suicidal ideas.     Objective:  BP 118/68 (BP Location: Left Arm, Patient  Position: Sitting, Cuff Size: Normal)   Pulse 83   Temp 98.1 F (36.7 C) (Oral)   Resp 16   Ht 5\' 2"  (1.575 m)   Wt 174 lb 3.2 oz (79 kg)   SpO2 94%   BMI 31.86 kg/m   BP Readings from Last 3 Encounters:  12/11/23 118/68  07/24/23 128/82  07/12/23 124/88    Wt Readings from Last 3 Encounters:  12/11/23 174 lb 3.2 oz (79 kg)  07/24/23 173 lb (78.5 kg)  07/12/23 174 lb 6 oz (79.1 kg)    Physical Exam Vitals reviewed.  Constitutional:      Appearance: Normal appearance.  HENT:     Nose: Nose normal.     Mouth/Throat:     Mouth: Mucous membranes are moist.  Eyes:     General: No scleral icterus.    Conjunctiva/sclera: Conjunctivae normal.  Cardiovascular:     Rate and Rhythm: Normal rate and regular rhythm.     Heart sounds: No murmur heard.    No friction rub. No gallop.     Comments: EKG----  NSR, 82 bpm Anterior/inferior infarct patterns are not new No LVH Unchanged Pulmonary:     Effort: Pulmonary effort is normal.     Breath sounds: No stridor. No wheezing, rhonchi or rales.  Abdominal:     General: Abdomen is flat.     Palpations: There is no mass.     Tenderness: There is no abdominal tenderness. There is no guarding.     Hernia: No hernia is present.  Musculoskeletal:        General: Normal range of motion.     Cervical back: Neck supple.     Right lower leg: No edema.     Left lower leg: No edema.  Lymphadenopathy:     Cervical: No cervical adenopathy.  Skin:    General: Skin is warm and dry.  Neurological:     General: No focal deficit present.     Mental Status: She is alert.  Psychiatric:        Mood and Affect: Mood normal.        Behavior: Behavior normal.     Lab Results  Component Value Date   WBC 8.5 11/15/2022   HGB 14.9 11/15/2022   HCT 44.6 11/15/2022   PLT 306.0 11/15/2022   GLUCOSE 90 05/09/2023   CHOL 213 (H) 05/09/2023   TRIG 102.0 05/09/2023   HDL 62.80 05/09/2023   LDLCALC 130 (H) 05/09/2023   ALT 15 05/09/2023    AST 14 05/09/2023   NA 139 05/09/2023   K 4.9 05/09/2023   CL 105 05/09/2023   CREATININE 0.79 05/09/2023   BUN 16 05/09/2023   CO2 29 05/09/2023   TSH 1.54 03/13/2022   HGBA1C 6.2 05/09/2023    DG Finger Index Right Result Date: 06/30/2023 CLINICAL DATA:  Pointer finger injury sustained cutting down a tree. EXAM: RIGHT INDEX FINGER 2+V COMPARISON:  None Available. FINDINGS: There are moderate degenerative changes at the distal interphalangeal joint with joint space narrowing and fragmented osteophytes. No evidence of acute fracture or dislocation. The proximal interphalangeal and metacarpal phalangeal joint spaces are preserved. There is soft tissue swelling and irregularity distally suspicious for soft tissue injury. No foreign body or definite soft tissue emphysema allowing for the artifact associated with the overlying bandages. IMPRESSION: Soft tissue swelling and irregularity distally suspicious for soft tissue injury. No evidence of acute fracture or dislocation. Electronically Signed   By: Carey Bullocks M.D.   On: 06/30/2023 14:30   DG Lumbar Spine Complete Result Date: 06/30/2023 CLINICAL DATA:  Back pain after a fall. EXAM: LUMBAR SPINE - COMPLETE 4+ VIEW COMPARISON:  05/02/2023 FINDINGS: No evidence for an acute fracture. Trace anterolisthesis of L4 on 5 is likely degenerative. SI joints unremarkable. 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 IMPRESSION: 1. Stable.  No acute bony abnormality. 2. Trace anterolisthesis of L4 on 5 is likely degenerative. 3. 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 Electronically Signed   By: Kennith Center M.D.   On: 06/30/2023 14:29    Assessment & Plan:  Primary hypertension -     EKG 12-Lead -     Basic metabolic panel; Future -     CBC with Differential/Platelet;  Future  Prediabetes -     Basic metabolic panel; Future -     Hemoglobin A1c; Future  Restless leg -     Ambulatory referral to Sleep Studies  Dyslipidemia, goal LDL below 100 -     TSH; Future -     Hepatic function panel; Future -     Pitavastatin Calcium; Take 1 tablet (2 mg total) by mouth daily.  Dispense: 90 tablet; Refill: 1     Follow-up: Return in about 6 months (around 06/12/2024).  Sanda Linger, MD

## 2023-12-11 NOTE — Patient Instructions (Signed)
 Hypertension, Adult High blood pressure (hypertension) is when the force of blood pumping through the arteries is too strong. The arteries are the blood vessels that carry blood from the heart throughout the body. Hypertension forces the heart to work harder to pump blood and may cause arteries to become narrow or stiff. Untreated or uncontrolled hypertension can lead to a heart attack, heart failure, a stroke, kidney disease, and other problems. A blood pressure reading consists of a higher number over a lower number. Ideally, your blood pressure should be below 120/80. The first ("top") number is called the systolic pressure. It is a measure of the pressure in your arteries as your heart beats. The second ("bottom") number is called the diastolic pressure. It is a measure of the pressure in your arteries as the heart relaxes. What are the causes? The exact cause of this condition is not known. There are some conditions that result in high blood pressure. What increases the risk? Certain factors may make you more likely to develop high blood pressure. Some of these risk factors are under your control, including: Smoking. Not getting enough exercise or physical activity. Being overweight. Having too much fat, sugar, calories, or salt (sodium) in your diet. Drinking too much alcohol. Other risk factors include: Having a personal history of heart disease, diabetes, high cholesterol, or kidney disease. Stress. Having a family history of high blood pressure and high cholesterol. Having obstructive sleep apnea. Age. The risk increases with age. What are the signs or symptoms? High blood pressure may not cause symptoms. Very high blood pressure (hypertensive crisis) may cause: Headache. Fast or irregular heartbeats (palpitations). Shortness of breath. Nosebleed. Nausea and vomiting. Vision changes. Severe chest pain, dizziness, and seizures. How is this diagnosed? This condition is diagnosed by  measuring your blood pressure while you are seated, with your arm resting on a flat surface, your legs uncrossed, and your feet flat on the floor. The cuff of the blood pressure monitor will be placed directly against the skin of your upper arm at the level of your heart. Blood pressure should be measured at least twice using the same arm. Certain conditions can cause a difference in blood pressure between your right and left arms. If you have a high blood pressure reading during one visit or you have normal blood pressure with other risk factors, you may be asked to: Return on a different day to have your blood pressure checked again. Monitor your blood pressure at home for 1 week or longer. If you are diagnosed with hypertension, you may have other blood or imaging tests to help your health care provider understand your overall risk for other conditions. How is this treated? This condition is treated by making healthy lifestyle changes, such as eating healthy foods, exercising more, and reducing your alcohol intake. You may be referred for counseling on a healthy diet and physical activity. Your health care provider may prescribe medicine if lifestyle changes are not enough to get your blood pressure under control and if: Your systolic blood pressure is above 130. Your diastolic blood pressure is above 80. Your personal target blood pressure may vary depending on your medical conditions, your age, and other factors. Follow these instructions at home: Eating and drinking  Eat a diet that is high in fiber and potassium, and low in sodium, added sugar, and fat. An example of this eating plan is called the DASH diet. DASH stands for Dietary Approaches to Stop Hypertension. To eat this way: Eat  plenty of fresh fruits and vegetables. Try to fill one half of your plate at each meal with fruits and vegetables. Eat whole grains, such as whole-wheat pasta, brown rice, or whole-grain bread. Fill about one  fourth of your plate with whole grains. Eat or drink low-fat dairy products, such as skim milk or low-fat yogurt. Avoid fatty cuts of meat, processed or cured meats, and poultry with skin. Fill about one fourth of your plate with lean proteins, such as fish, chicken without skin, beans, eggs, or tofu. Avoid pre-made and processed foods. These tend to be higher in sodium, added sugar, and fat. Reduce your daily sodium intake. Many people with hypertension should eat less than 1,500 mg of sodium a day. Do not drink alcohol if: Your health care provider tells you not to drink. You are pregnant, may be pregnant, or are planning to become pregnant. If you drink alcohol: Limit how much you have to: 0-1 drink a day for women. 0-2 drinks a day for men. Know how much alcohol is in your drink. In the [place] one drink equals one 12 oz bottle of beer (355 mL), one 5 oz glass of wine (148 mL), or one 1 oz glass of hard liquor (44 mL). Lifestyle  Work with your health care provider to maintain a healthy body weight or to lose weight. Ask what an ideal weight is for you. Get at least 30 minutes of exercise that causes your heart to beat faster (aerobic exercise) most days of the week. Activities may include walking, swimming, or biking. Include exercise to strengthen your muscles (resistance exercise), such as Pilates or lifting weights, as part of your weekly exercise routine. Try to do these types of exercises for 30 minutes at least 3 days a week. Do not use any products that contain nicotine or tobacco. These products include cigarettes, chewing tobacco, and vaping devices, such as e-cigarettes. If you need help quitting, ask your health care provider. Monitor your blood pressure at home as told by your health care provider. Keep all follow-up visits. This is important. Medicines Take over-the-counter and prescription medicines only as told by your health care provider. Follow directions carefully. Blood  pressure medicines must be taken as prescribed. Do not skip doses of blood pressure medicine. Doing this puts you at risk for problems and can make the medicine less effective. Ask your health care provider about side effects or reactions to medicines that you should watch for. Contact a health care provider if you: Think you are having a reaction to a medicine you are taking. Have headaches that keep coming back (recurring). Feel dizzy. Have swelling in your ankles. Have trouble with your vision. Get help right away if you: Develop a severe headache or confusion. Have unusual weakness or numbness. Feel faint. Have severe pain in your chest or abdomen. Vomit repeatedly. Have trouble breathing. These symptoms may be an emergency. Get help right away. Call 911. Do not wait to see if the symptoms will go away. Do not drive yourself to the hospital. Summary Hypertension is when the force of blood pumping through your arteries is too strong. If this condition is not controlled, it may put you at risk for serious complications. Your personal target blood pressure may vary depending on your medical conditions, your age, and other factors. For most people, a normal blood pressure is less than 120/80. Hypertension is treated with lifestyle changes, medicines, or a combination of both. Lifestyle changes include losing weight, eating a healthy,  low-sodium diet, exercising more, and limiting alcohol. This information is not intended to replace advice given to you by your health care provider. Make sure you discuss any questions you have with your health care provider. Document Revised: 07/26/2021 Document Reviewed: 07/26/2021 Elsevier Patient Education  2024 [employer].

## 2023-12-13 MED ORDER — ZOLPIDEM TARTRATE 10 MG PO TABS
5.0000 mg | ORAL_TABLET | Freq: Every evening | ORAL | 1 refills | Status: DC | PRN
Start: 2023-12-13 — End: 2024-02-11

## 2023-12-13 MED ORDER — FAMOTIDINE 40 MG PO TABS
40.0000 mg | ORAL_TABLET | Freq: Every day | ORAL | 1 refills | Status: DC
Start: 2023-12-13 — End: 2024-05-19

## 2023-12-14 ENCOUNTER — Encounter: Payer: Self-pay | Admitting: Internal Medicine

## 2023-12-27 ENCOUNTER — Ambulatory Visit: Admitting: Neurology

## 2023-12-27 ENCOUNTER — Encounter: Payer: Self-pay | Admitting: Neurology

## 2023-12-27 VITALS — BP 150/96 | HR 72 | Ht 62.0 in | Wt 180.0 lb

## 2023-12-27 DIAGNOSIS — G2581 Restless legs syndrome: Secondary | ICD-10-CM

## 2023-12-27 DIAGNOSIS — R0683 Snoring: Secondary | ICD-10-CM

## 2023-12-27 DIAGNOSIS — G478 Other sleep disorders: Secondary | ICD-10-CM | POA: Diagnosis not present

## 2023-12-27 DIAGNOSIS — G4700 Insomnia, unspecified: Secondary | ICD-10-CM

## 2023-12-27 DIAGNOSIS — R0681 Apnea, not elsewhere classified: Secondary | ICD-10-CM

## 2023-12-27 DIAGNOSIS — G479 Sleep disorder, unspecified: Secondary | ICD-10-CM | POA: Diagnosis not present

## 2023-12-27 DIAGNOSIS — E66811 Obesity, class 1: Secondary | ICD-10-CM

## 2023-12-27 DIAGNOSIS — Z9189 Other specified personal risk factors, not elsewhere classified: Secondary | ICD-10-CM

## 2023-12-27 NOTE — Progress Notes (Signed)
 Subjective:    Patient ID: Cathy Goodwin is a 68 y.o. female.  HPI    Huston Foley, MD, PhD [facility] [address] [address] [place], [place] [address]  Dear Dr. Yetta Barre,  I saw your patient, Cathy Goodwin, upon your kind request in my sleep clinic today for initial consultation of her sleep disorder, in particular, concern for restless leg syndrome. The patient is unaccompanied today.  As you know, Cathy Goodwin is a 68 year old female with an underlying medical history of hypertension, dyslipidemia, restless leg syndrome, prediabetes, lumbar radiculopathy and lumbar spinal stenosis, pain in the thoracic and lower back, chronic hip pain, chronic sinusitis, deviated septum, diverticulosis, reflux disease, and mild obesity, who reports a longstanding history of restless legs syndrome, essentially since childhood. She has a FHx of RLS, affecting her mom and 2 sisters.  She has never been on a dopamine agonist and has been resistant to medications.  She has been on gabapentin off and on for about 5 years, she just recently started taking it regularly particularly for her back pain.  About 6 months ago she started taking it daily, as prescribed by her orthopedic doctor. Some 3 months ago it was increased from 300 mg at bedtime to 600 mg at bedtime.  She also takes Ambien at night, 5 to 10 mg each night, she takes Xanax in the evening as well, 0.5 mg strength.  She lives alone, she is divorced but still has a relationship with her ex-husband.  He has recently told her that she snores and has pauses in her breathing while asleep.  She has never had a sleep study.  She does not wake up rested.  Her Epworth sleepiness score is 5 out of 24, fatigue severity score is 28 out of 63.  She limits her caffeine to 1 cup of coffee.  She is trying to quit smoking, she smokes about 1 or 2 cigarettes/day on average.  She drinks alcohol rarely.  She works full-time in Clinical biochemist.   She goes to bed around 10 PM and rise time is around 6:30 AM.  I reviewed your office note from 12/11/2023.  She has been on gabapentin and tramadol and also takes zolpidem as needed to help her sleep at night.  Of note, she also has a prescription for Xanax as needed.  She does have a TV in her bedroom.  She has 1 dog in her household and the dog tends to sleep on the bed with her.  She is familiar with sleep apnea as her ex-husband has a diagnosis and had surgery for it.  She had blood work on 12/11/2023 and I was able to review her test results in her electronic chart.  TSH was normal at 0.65, A1c was in the prediabetes range at 6.1, liver panel unremarkable, CBC with differential showed elevated neutrophils and decreased lymphocytes and monocytes.  BMP unremarkable with the exception of random glucose at 140.  She has low vitamin D, last value in her chart was 22.15 on 05/09/2023.  Iron studies were done in June 2023 and her ferritin was 71.4 at the time.  Her Past Medical History Is Significant For: Past Medical History:  Diagnosis Date   Anxiety    Chronic pansinusitis    Deviated septum    Diverticulosis    Family history of colon cancer in father    GERD (gastroesophageal reflux disease)    History of ductal carcinoma in situ (DCIS) of breast  Hypertension    Personal history of radiation therapy    RLS (restless legs syndrome)    Spinal stenosis of lumbar region without neurogenic claudication     Her Past Surgical History Is Significant For: Past Surgical History:  Procedure Laterality Date   BREAST LUMPECTOMY Left 07/2019   WISDOM TOOTH EXTRACTION      Her Family History Is Significant For: Family History  Problem Relation Age of Onset   Heart disease Mother    Cancer Father        colon, prostate   Heart disease Father 59       CABG, CHF   Hypertension Sister    Stomach cancer Neg Hx    Esophageal cancer Neg Hx     Her Social History Is Significant For: Social  History   Socioeconomic History   Marital status: Legally Separated    Spouse name: Not on file   Number of children: 0   Years of education: Not on file   Highest education level: Associate degree: academic program  Occupational History   Occupation: CSR  Tobacco Use   Smoking status: Every Day    Current packs/day: 0.50    Average packs/day: 0.5 packs/day for 20.0 years (10.0 ttl pk-yrs)    Types: Cigarettes   Smokeless tobacco: Never  Vaping Use   Vaping status: Never Used  Substance and Sexual Activity   Alcohol use: Yes    Alcohol/week: 1.0 standard drink of alcohol    Types: 1 Shots of liquor per week    Comment: occ   Drug use: No   Sexual activity: Yes    Partners: Male  Other Topics Concern   Not on file  Social History Narrative   Lives alone.     Social Drivers of Health   Financial Resource Strain: Patient Declined (05/05/2023)   Overall Financial Resource Strain (CARDIA)    Difficulty of Paying Living Expenses: Patient declined  Food Insecurity: Patient Declined (05/05/2023)   Hunger Vital Sign    Worried About Running Out of Food in the Last Year: Patient declined    Ran Out of Food in the Last Year: Patient declined  Transportation Needs: No Transportation Needs (05/05/2023)   PRAPARE - Administrator, Civil Service (Medical): No    Lack of Transportation (Non-Medical): No  Physical Activity: Unknown (05/05/2023)   Exercise Vital Sign    Days of Exercise per Week: 0 days    Minutes of Exercise per Session: Not on file  Stress: Stress Concern Present (05/05/2023)   [employer] of Occupational Health - Occupational Stress Questionnaire    Feeling of Stress : Very much  Social Connections: Unknown (05/05/2023)   Social Connection and Isolation Panel [NHANES]    Frequency of Communication with Friends and Family: More than three times a week    Frequency of Social Gatherings with Friends and Family: More than three times a week    Attends  Religious Services: Patient declined    Database administrator or Organizations: No    Attends Engineer, structural: Not on file    Marital Status: Separated    Her Allergies Are:  Allergies  Allergen Reactions   Lipitor [Atorvastatin] Other (See Comments)    Myalgia   :   Her Current Medications Are:  Outpatient Encounter Medications as of 12/27/2023  Medication Sig   acetaminophen (TYLENOL) 500 MG tablet Take 1 tablet (500 mg total) by mouth every 6 (six) hours as  needed for moderate pain.   ALPRAZolam (XANAX) 0.5 MG tablet TAKE 1 TABLET BY MOUTH AT BEDTIME AS NEEDED FOR ANXIETY OR SLEEP   azelastine (ASTELIN) 0.1 % nasal spray Place 2 sprays into both nostrils daily as needed for allergies.   dicyclomine (BENTYL) 10 MG capsule Take 1 capsule (10 mg total) by mouth every 8 (eight) hours as needed for spasms.   famotidine (PEPCID) 40 MG tablet Take 1 tablet (40 mg total) by mouth daily.   fluticasone (FLONASE) 50 MCG/ACT nasal spray Place 1 spray into both nostrils 2 (two) times daily.   gabapentin (NEURONTIN) 300 MG capsule TAKE 1 CAPSULE BY MOUTH EVERYDAY AT BEDTIME   methylcellulose (CITRUCEL) oral powder Take 1 packet by mouth daily.   montelukast (SINGULAIR) 10 MG tablet Take 1 tablet by mouth daily as needed (allergies).   omeprazole (PRILOSEC OTC) 20 MG tablet Take 1 tablet (20 mg total) by mouth daily.   traMADol (ULTRAM-ER) 100 MG 24 hr tablet TAKE 1 TABLET (100 MG TOTAL) BY MOUTH DAILY AS NEEDED FOR PAIN.   Vitamin D, Ergocalciferol, (DRISDOL) 1.25 MG (50000 UNIT) CAPS capsule TAKE 1 CAPSULE (50,000 UNITS TOTAL) BY MOUTH EVERY 7 (SEVEN) DAYS   zolpidem (AMBIEN) 10 MG tablet Take 0.5-1 tablets (5-10 mg total) by mouth at bedtime as needed. for sleep   Pitavastatin Calcium 2 MG TABS Take 1 tablet (2 mg total) by mouth daily. (Patient not taking: Reported on 12/27/2023)   No facility-administered encounter medications on file as of 12/27/2023.  :   Review of Systems:   Out of a complete 14 point review of systems, all are reviewed and negative with the exception of these symptoms as listed below:  Review of Systems  Neurological:        Pt is here Alone. Pt states that she has burning in her legs, and she can't stop moving. Pt states that sometimes at night her whole body will tense up like it is going to spasm. Pt states that she has dizzy spells. Pt states that since she was younger if she stumped her toe or hurt herself she will pass out due to the pain. Pt states that about a month ago, she had a pass out spell, as well as having vertigo and vomiting. Pt states that her pass spell she wasn't in pain she just passed out.      Objective:  Neurological Exam  Physical Exam Physical Examination:   Vitals:   12/27/23 1403  BP: (!) 150/96  Pulse: 72    General Examination: The patient is a very pleasant 68 y.o. female in no acute distress. She appears well-developed and well-nourished and well groomed.   HEENT: Normocephalic, atraumatic, pupils are equal, round and reactive to light, extraocular tracking is good without limitation to gaze excursion or nystagmus noted.  Corrective eyeglasses in place.  Hearing is grossly intact. Face is symmetric with normal facial animation. Speech is clear with no dysarthria noted. There is no hypophonia. There is no lip, neck/head, jaw or voice tremor. Neck is supple with full range of passive and active motion. There are no carotid bruits on auscultation. Oropharynx exam reveals: mild mouth dryness, adequate dental hygiene and moderate airway crowding, due to small airway entry and redundant soft palate.  Tonsils on the smaller side, Mallampati class III.  Neck circumference 14 three-quarter inches, very slight overbite noted.  Tongue protrudes centrally and palate elevates symmetrically.  Chest: Clear to auscultation without wheezing, rhonchi or crackles noted.  Heart: S1+S2+0, regular and normal without murmurs, rubs  or gallops noted.   Abdomen: Soft, non-tender and non-distended.  Extremities: There is no pitting edema in the distal lower extremities bilaterally.   Skin: Warm and dry without trophic changes noted.   Musculoskeletal: exam reveals no obvious joint deformities.   Neurologically:  Mental status: The patient is awake, alert and oriented in all 4 spheres. Her immediate and remote memory, attention, language skills and fund of knowledge are appropriate. There is no evidence of aphasia, agnosia, apraxia or anomia. Speech is clear with normal prosody and enunciation. Thought process is linear. Mood is normal and affect is normal.  Cranial nerves II - XII are as described above under HEENT exam.  Motor exam: Normal bulk, strength and tone is noted. There is no obvious action or resting tremor.  Fine motor skills and coordination: grossly intact.  Cerebellar testing: No dysmetria or intention tremor. There is no truncal or gait ataxia.  Sensory exam: intact to light touch in the upper and lower extremities.  Gait, station and balance: She stands easily. No veering to one side is noted. No leaning to one side is noted. Posture is age-appropriate and stance is narrow based. Gait shows normal stride length and normal pace. No problems turning are noted.   Assessment and plan:  In summary, Cathy Goodwin is a very pleasant 68 y.o.-year old female with an underlying medical history of hypertension, dyslipidemia, restless leg syndrome, prediabetes, lumbar radiculopathy and lumbar spinal stenosis, pain in the thoracic and lower back, chronic hip pain, chronic sinusitis, deviated septum, diverticulosis, reflux disease, and mild obesity, who presents for evaluation of her longstanding history of restless leg syndrome.  She has a positive family history of RLS.  She is currently on symptomatic treatment with gabapentin which was recently increased from 300 mg at bedtime to 600 mg at bedtime and she also has as  needed tramadol long-acting.  She takes additional potentially sedating medications at night including Xanax and Ambien.  She may be at risk for obstructive sleep apnea and has never been tested with a sleep study.   I had a long chat with the patient about my findings and the diagnosis of her RLS and sleep apnea and a potential interconnection of these 2 conditions.  We talked about symptomatic treatment options for restless leg syndrome.  We also talked about medical/conservative treatments, surgical interventions and non-pharmacological approaches for symptom control. I explained, in particular, the risks and ramifications of untreated moderate to severe OSA, especially with respect to developing cardiovascular disease down the road, including congestive heart failure (CHF), difficult to treat hypertension, cardiac arrhythmias (particularly A-fib), neurovascular complications including TIA, stroke and dementia. Even type 2 diabetes has, in part, been linked to untreated OSA. Symptoms of untreated OSA may include (but may not be limited to) daytime sleepiness, nocturia (i.e. frequent nighttime urination), memory problems, mood irritability and suboptimally controlled or worsening mood disorder such as depression and/or anxiety, lack of energy, lack of motivation, physical discomfort, as well as recurrent headaches, especially morning or nocturnal headaches. We talked about the importance of maintaining a healthy lifestyle and striving for healthy weight.  The importance of complete smoking cessation was also addressed.  In addition, we talked about the importance of striving for and maintaining good sleep hygiene. I recommended a sleep study at this time. I outlined the differences between a laboratory attended sleep study which is considered more comprehensive and accurate over the option of a home sleep  test (HST); the latter may lead to underestimation of sleep disordered breathing in some instances and  does not help with diagnosing upper airway resistance syndrome and is not accurate enough to diagnose primary central sleep apnea typically. I outlined possible surgical and non-surgical treatment options of OSA, including the use of a positive airway pressure (PAP) device (i.e. CPAP, AutoPAP/APAP or BiPAP in certain circumstances), a custom-made dental device (aka oral appliance, which would require a referral to a specialist dentist or orthodontist typically, and is generally speaking not considered for patients with full dentures or edentulous state), upper airway surgical options, such as traditional UPPP (which is not considered a first-line treatment) or the Inspire device (hypoglossal nerve stimulator, which would involve a referral for consultation with an ENT surgeon, after careful selection, following inclusion criteria - also not first-line treatment). I explained the PAP treatment option to the patient in detail, as this is generally considered first-line treatment.  The patient indicated that she would be reluctant but willing to try PAP therapy, if the need arises. I explained the importance of being compliant with PAP treatment, not only for insurance purposes but primarily to improve patient's symptoms symptoms, and for the patient's long term health benefit, including to reduce Her cardiovascular risks longer-term.    We will pick up our discussion about the next steps and treatment options after testing.  We will keep her posted as to the test results by phone call and/or MyChart messaging where possible.  We will plan to follow-up in sleep clinic accordingly as well.  For now, she is advised to continue with her medication regimen but cautioned with regards to the use of Ambien nightly and Xanax nightly.    I answered all her questions today and the patient was in agreement.  This was an extended visit with copious record review involved and addressing multiple issues and significant  counseling and coordination of care involved.  I encouraged her to call with any interim questions, concerns, problems or updates or email [place] through MyChart.  Generally speaking, sleep test authorizations may take up to 2 weeks, sometimes less, sometimes longer, the patient is encouraged to get in touch with [place] if they do not hear back from the sleep lab staff directly within the next 2 weeks.  Thank you very much for allowing me to participate in the care of this nice patient. If I can be of any further assistance to you please do not hesitate to call me at [phone].  Sincerely,   Huston Foley, MD, PhD

## 2023-12-27 NOTE — Patient Instructions (Signed)
 Thank you for choosing [facility] for your sleep related care! It was nice to meet you today!   Here is what we discussed today:   I recommend you continue with your current medication regimen but be cautious with the Xanax and Ambien on a nightly basis as these medicines are quite sedating.  Ambien may potentially make your restless legs worse and also antihistamine particularly Benadryl can make you RLS worse.   Based on your symptoms and your exam I believe you are at risk for obstructive sleep apnea (aka OSA). We should proceed with a sleep study to determine whether you do or do not have OSA and how severe it is. Even, if you have mild OSA, I may want you to consider treatment with CPAP, as treatment of even borderline or mild sleep apnea can result and improvement of symptoms such as sleep disruption, daytime sleepiness, nighttime bathroom breaks, restless leg symptoms, improvement of headache syndromes, even improved mood disorder.   As explained, an attended sleep study (meaning you get to stay overnight in the sleep lab), lets [place] monitor sleep-related behaviors such as sleep talking and leg movements in sleep, in addition to monitoring for sleep apnea.  A home sleep test is a screening tool for sleep apnea diagnosis only, but unfortunately, does not help with any other sleep-related diagnoses.  Please remember, the long-term risks and ramifications of untreated moderate to severe obstructive sleep apnea may include (but are not limited to): increased risk for cardiovascular disease, including congestive heart failure, stroke, difficult to control hypertension, treatment resistant obesity, arrhythmias, especially irregular heartbeat commonly known as A. Fib. (atrial fibrillation); even type 2 diabetes has been linked to untreated OSA.   Other correlations that untreated obstructive sleep apnea include macular edema which is swelling of the retina in the eyes, droopy eyelid  syndrome, and elevated hemoglobin and hematocrit levels (often referred to as polycythemia).  Sleep apnea can cause disruption of sleep and sleep deprivation in most cases, which, in turn, can cause recurrent headaches, problems with memory, mood, concentration, focus, and vigilance. Most people with untreated sleep apnea report excessive daytime sleepiness, which can affect their ability to drive. Please do not drive or use heavy equipment or machinery, if you feel sleepy! Patients with sleep apnea can also develop difficulty initiating and maintaining sleep (aka insomnia).   Having sleep apnea may increase your risk for other sleep disorders, including involuntary behaviors sleep such as sleep terrors, sleep talking, sleepwalking.    Having sleep apnea can also increase your risk for restless leg syndrome and leg movements at night.   Please note that untreated obstructive sleep apnea may carry additional perioperative morbidity. Patients with significant obstructive sleep apnea (typically, in the moderate to severe degree) should receive, if possible, perioperative PAP (positive airway pressure) therapy and the surgeons and particularly the anesthesiologists should be informed of the diagnosis and the severity of the sleep disordered breathing.   We will call you or email you through MyChart with regards to your test results and plan a follow-up in sleep clinic accordingly. Most likely, you will hear from one of our nurses.   Our sleep lab administrative assistant will call you to schedule your sleep study and give you further instructions, regarding the check in process for the sleep study, arrival time, what to bring, when you can expect to leave after the study, etc., and to answer any other logistical questions you may have. If you don't hear back from her  by about 2 weeks from now, please feel free to call her direct line at [phone] or you can call our general clinic number, or email [place]  through My Chart.

## 2024-01-03 ENCOUNTER — Ambulatory Visit
Admission: RE | Admit: 2024-01-03 | Discharge: 2024-01-03 | Disposition: A | Discharge status: Home / Self Care | Payer: BC Managed Care – PPO | Source: Ambulatory Visit | Attending: Internal Medicine | Admitting: Internal Medicine

## 2024-01-03 DIAGNOSIS — E2839 Other primary ovarian failure: Secondary | ICD-10-CM | POA: Diagnosis not present

## 2024-01-03 DIAGNOSIS — M8588 Other specified disorders of bone density and structure, other site: Secondary | ICD-10-CM | POA: Diagnosis not present

## 2024-01-03 DIAGNOSIS — N958 Other specified menopausal and perimenopausal disorders: Secondary | ICD-10-CM | POA: Diagnosis not present

## 2024-01-04 ENCOUNTER — Encounter: Payer: Self-pay | Admitting: Internal Medicine

## 2024-01-04 DIAGNOSIS — Z780 Asymptomatic menopausal state: Secondary | ICD-10-CM | POA: Diagnosis not present

## 2024-01-04 DIAGNOSIS — M810 Age-related osteoporosis without current pathological fracture: Secondary | ICD-10-CM | POA: Diagnosis not present

## 2024-01-09 ENCOUNTER — Telehealth: Payer: Self-pay | Admitting: Neurology

## 2024-01-09 NOTE — Telephone Encounter (Signed)
 LVM for pt to call back to schedule   [employer] No auth req spoke to [place] ref # [id number]

## 2024-01-13 ENCOUNTER — Other Ambulatory Visit: Payer: Self-pay | Admitting: Internal Medicine

## 2024-01-13 DIAGNOSIS — F5104 Psychophysiologic insomnia: Secondary | ICD-10-CM

## 2024-01-13 DIAGNOSIS — G2581 Restless legs syndrome: Secondary | ICD-10-CM

## 2024-01-13 DIAGNOSIS — F411 Generalized anxiety disorder: Secondary | ICD-10-CM

## 2024-01-14 ENCOUNTER — Other Ambulatory Visit: Payer: Self-pay | Admitting: Internal Medicine

## 2024-01-14 DIAGNOSIS — M810 Age-related osteoporosis without current pathological fracture: Secondary | ICD-10-CM | POA: Insufficient documentation

## 2024-01-14 MED ORDER — DENOSUMAB 60 MG/ML ~~LOC~~ SOSY: 60.0000 mg | PREFILLED_SYRINGE | Freq: Once | SUBCUTANEOUS | Status: AC

## 2024-01-16 ENCOUNTER — Other Ambulatory Visit: Payer: Self-pay | Admitting: Internal Medicine

## 2024-01-16 DIAGNOSIS — G2581 Restless legs syndrome: Secondary | ICD-10-CM

## 2024-01-16 DIAGNOSIS — F5104 Psychophysiologic insomnia: Secondary | ICD-10-CM

## 2024-01-16 DIAGNOSIS — F411 Generalized anxiety disorder: Secondary | ICD-10-CM

## 2024-01-16 NOTE — Telephone Encounter (Signed)
 I spoke with the patient she stated she wants to hold off right now on doing the SS but will call if she wants to schedule.

## 2024-01-18 ENCOUNTER — Other Ambulatory Visit: Payer: Self-pay | Admitting: Internal Medicine

## 2024-01-18 ENCOUNTER — Telehealth: Payer: Self-pay

## 2024-01-18 DIAGNOSIS — G2581 Restless legs syndrome: Secondary | ICD-10-CM

## 2024-01-18 DIAGNOSIS — F5104 Psychophysiologic insomnia: Secondary | ICD-10-CM

## 2024-01-18 DIAGNOSIS — F411 Generalized anxiety disorder: Secondary | ICD-10-CM

## 2024-01-18 NOTE — Telephone Encounter (Signed)
 Prolia VOB initiated via [email or web address]  Next Prolia inj DUE: NEW START

## 2024-01-21 ENCOUNTER — Telehealth: Payer: Self-pay | Admitting: Internal Medicine

## 2024-01-21 ENCOUNTER — Other Ambulatory Visit: Payer: Self-pay | Admitting: Internal Medicine

## 2024-01-21 DIAGNOSIS — E559 Vitamin D deficiency, unspecified: Secondary | ICD-10-CM

## 2024-01-21 NOTE — Telephone Encounter (Signed)
 Medication has been requested multiple times with no response.   Prescription Request  01/21/2024  LOV: 12/11/2023  What is the name of the medication or equipment? ALPRAZolam  (XANAX ) 0.5 MG tablet   Have you contacted your pharmacy to request a refill? Yes   Which pharmacy would you like this sent to?  [id number] - [place], [place] - [address] [place] [address] Phone: [phone] Fax: [phone]   Patient notified that their request is being sent to the clinical staff for review and that they should receive a response within 2 business days.   Please advise at Mobile [phone] (mobile)

## 2024-01-22 ENCOUNTER — Other Ambulatory Visit: Payer: Self-pay | Admitting: Internal Medicine

## 2024-01-22 DIAGNOSIS — F411 Generalized anxiety disorder: Secondary | ICD-10-CM

## 2024-01-22 DIAGNOSIS — G2581 Restless legs syndrome: Secondary | ICD-10-CM

## 2024-01-22 DIAGNOSIS — F5104 Psychophysiologic insomnia: Secondary | ICD-10-CM

## 2024-01-23 MED ORDER — ALPRAZOLAM 0.5 MG PO TABS: 0.5000 mg | ORAL_TABLET | Freq: Every evening | ORAL | 1 refills | Status: DC | PRN

## 2024-01-23 MED ORDER — MONTELUKAST SODIUM 10 MG PO TABS: 10.0000 mg | ORAL_TABLET | Freq: Every day | ORAL | 1 refills | Status: AC | PRN

## 2024-01-23 MED ORDER — VITAMIN D (ERGOCALCIFEROL) 1.25 MG (50000 UNIT) PO CAPS
50000.0000 [IU] | ORAL_CAPSULE | ORAL | 0 refills | Status: DC
Start: 2024-01-23 — End: 2024-05-19

## 2024-01-24 NOTE — Telephone Encounter (Signed)
 This refill has been handled.

## 2024-01-24 NOTE — Telephone Encounter (Signed)
 Cathy Goodwin

## 2024-01-28 ENCOUNTER — Other Ambulatory Visit: Payer: Self-pay | Admitting: Internal Medicine

## 2024-01-28 DIAGNOSIS — I10 Essential (primary) hypertension: Secondary | ICD-10-CM

## 2024-01-29 NOTE — Telephone Encounter (Signed)
 PA submitted via phone [phone]). Case ID# [id number]  Turnaround time is 5 days

## 2024-01-30 ENCOUNTER — Other Ambulatory Visit (HOSPITAL_COMMUNITY): Payer: Self-pay

## 2024-01-30 NOTE — Telephone Encounter (Signed)
 Pharmacy Patient Advocate Encounter  Received notification from [facility] that Prior Authorization for PROLIA  has been APPROVED from 01/29/24 to 01/27/25   PA #/Case ID/Reference #: [id number]

## 2024-01-30 NOTE — Telephone Encounter (Signed)
 Pt ready for scheduling for PROLIA  on or after : 01/30/24  Option# 1: Buy/Bill (Office supplied medication)  Out-of-pocket cost due at time of clinic visit: $0  Number of injection/visits approved: 3  Primary: [employer] [place] Prolia  co-insurance: 0% Admin fee co-insurance: 0%  Secondary: --- Prolia  co-insurance:  Admin fee co-insurance:   Medical Benefit Details: Date Benefits were checked: 01/23/24 Deductible: NO/ Coinsurance: 0%/ Admin Fee: 0%  Prior Auth: APPROVED PA# [id number] Expiration Date: 01/29/24-01/27/25   # of doses approved: 3 ----------------------------------------------------------------------- Option# 2- Med Obtained from pharmacy:  Pharmacy benefit: Copay $--- (Paid to pharmacy) Admin Fee: --- (Pay at clinic)  Prior Auth: --- PA# Expiration Date:   # of doses approved:   If patient wants fill through the pharmacy benefit please send prescription to:  --- , and include estimated need by date in rx notes. Pharmacy will ship medication directly to the office.  Patient IS eligible for Prolia  Copay Card. Copay Card can make patient's cost as little as $25. Link to apply: [email or web address]://[email or web address].[email or web address]/copay  ** This summary of benefits is an estimation of the patient's out-of-pocket cost. Exact cost may very based on individual plan coverage.

## 2024-02-10 ENCOUNTER — Other Ambulatory Visit: Payer: Self-pay | Admitting: Internal Medicine

## 2024-02-10 DIAGNOSIS — G2581 Restless legs syndrome: Secondary | ICD-10-CM

## 2024-02-10 DIAGNOSIS — F5104 Psychophysiologic insomnia: Secondary | ICD-10-CM

## 2024-03-29 ENCOUNTER — Other Ambulatory Visit: Payer: Self-pay | Admitting: Internal Medicine

## 2024-03-29 DIAGNOSIS — M160 Bilateral primary osteoarthritis of hip: Secondary | ICD-10-CM

## 2024-03-29 DIAGNOSIS — M48061 Spinal stenosis, lumbar region without neurogenic claudication: Secondary | ICD-10-CM

## 2024-03-29 DIAGNOSIS — G2581 Restless legs syndrome: Secondary | ICD-10-CM

## 2024-04-03 ENCOUNTER — Inpatient Hospital Stay: Admitting: Internal Medicine

## 2024-04-08 ENCOUNTER — Ambulatory Visit: Admitting: Internal Medicine

## 2024-04-13 ENCOUNTER — Other Ambulatory Visit: Payer: Self-pay | Admitting: Internal Medicine

## 2024-04-13 DIAGNOSIS — E559 Vitamin D deficiency, unspecified: Secondary | ICD-10-CM

## 2024-05-02 DIAGNOSIS — M5416 Radiculopathy, lumbar region: Secondary | ICD-10-CM | POA: Diagnosis not present

## 2024-05-08 ENCOUNTER — Other Ambulatory Visit: Payer: Self-pay | Admitting: Internal Medicine

## 2024-05-08 DIAGNOSIS — M48061 Spinal stenosis, lumbar region without neurogenic claudication: Secondary | ICD-10-CM

## 2024-05-09 ENCOUNTER — Other Ambulatory Visit: Payer: Self-pay | Admitting: Internal Medicine

## 2024-05-09 DIAGNOSIS — F5104 Psychophysiologic insomnia: Secondary | ICD-10-CM

## 2024-05-09 DIAGNOSIS — G2581 Restless legs syndrome: Secondary | ICD-10-CM

## 2024-05-19 ENCOUNTER — Encounter: Payer: Self-pay | Admitting: Internal Medicine

## 2024-05-19 ENCOUNTER — Ambulatory Visit: Admitting: Internal Medicine

## 2024-05-19 VITALS — BP 144/86 | HR 70 | Temp 97.8°F | Ht 62.0 in | Wt 178.0 lb

## 2024-05-19 DIAGNOSIS — E785 Hyperlipidemia, unspecified: Secondary | ICD-10-CM | POA: Diagnosis not present

## 2024-05-19 DIAGNOSIS — I10 Essential (primary) hypertension: Secondary | ICD-10-CM | POA: Diagnosis not present

## 2024-05-19 DIAGNOSIS — R7303 Prediabetes: Secondary | ICD-10-CM

## 2024-05-19 DIAGNOSIS — E559 Vitamin D deficiency, unspecified: Secondary | ICD-10-CM

## 2024-05-19 DIAGNOSIS — F5104 Psychophysiologic insomnia: Secondary | ICD-10-CM

## 2024-05-19 DIAGNOSIS — Z23 Encounter for immunization: Secondary | ICD-10-CM | POA: Diagnosis not present

## 2024-05-19 DIAGNOSIS — Z0000 Encounter for general adult medical examination without abnormal findings: Secondary | ICD-10-CM | POA: Diagnosis not present

## 2024-05-19 DIAGNOSIS — Z0001 Encounter for general adult medical examination with abnormal findings: Secondary | ICD-10-CM

## 2024-05-19 DIAGNOSIS — R931 Abnormal findings on diagnostic imaging of heart and coronary circulation: Secondary | ICD-10-CM

## 2024-05-19 DIAGNOSIS — M810 Age-related osteoporosis without current pathological fracture: Secondary | ICD-10-CM | POA: Diagnosis not present

## 2024-05-19 DIAGNOSIS — K219 Gastro-esophageal reflux disease without esophagitis: Secondary | ICD-10-CM

## 2024-05-19 LAB — PHOSPHORUS: Phosphorus: 3.3 mg/dL (ref 2.3–4.6)

## 2024-05-19 LAB — BASIC METABOLIC PANEL WITH GFR
BUN: 16 mg/dL (ref 6–23)
CO2: 25 meq/L (ref 19–32)
Calcium: 8.8 mg/dL (ref 8.4–10.5)
Chloride: 106 meq/L (ref 96–112)
Creatinine, Ser: 0.78 mg/dL (ref 0.40–1.20)
GFR: 79.01 mL/min (ref >60.00)
Glucose, Bld: 115 mg/dL — ABNORMAL HIGH (ref 70–99)
Potassium: 4.2 meq/L (ref 3.5–5.1)
Sodium: 141 meq/L (ref 135–145)

## 2024-05-19 LAB — LIPID PANEL
Cholesterol: 227 mg/dL — ABNORMAL HIGH (ref 0–200)
HDL: 56.6 mg/dL (ref >39.00)
LDL Cholesterol: 110 mg/dL — ABNORMAL HIGH (ref 0–99)
NonHDL: 170.65
Total CHOL/HDL Ratio: 4
Triglycerides: 305 mg/dL — ABNORMAL HIGH (ref 0.0–149.0)
VLDL: 61 mg/dL — ABNORMAL HIGH (ref 0.0–40.0)

## 2024-05-19 LAB — VITAMIN D 25 HYDROXY (VIT D DEFICIENCY, FRACTURES): VITD: 20.81 ng/mL — ABNORMAL LOW (ref 30.00–100.00)

## 2024-05-19 LAB — HEMOGLOBIN A1C: Hgb A1c MFr Bld: 6.4 % (ref 4.6–6.5)

## 2024-05-19 MED ORDER — BELSOMRA 20 MG PO TABS: 20.0000 mg | ORAL_TABLET | Freq: Every evening | ORAL | 1 refills | Status: AC | PRN

## 2024-05-19 MED ORDER — OLMESARTAN MEDOXOMIL 40 MG PO TABS: 40.0000 mg | ORAL_TABLET | Freq: Every day | ORAL | 1 refills | Status: AC

## 2024-05-19 MED ORDER — ESOMEPRAZOLE MAGNESIUM 40 MG PO CPDR: 40.0000 mg | DELAYED_RELEASE_CAPSULE | Freq: Every day | ORAL | 1 refills | Status: AC

## 2024-05-19 MED ORDER — PITAVASTATIN CALCIUM 2 MG PO TABS
2.0000 mg | ORAL_TABLET | Freq: Every day | ORAL | 1 refills | Status: AC
Start: 2024-05-19 — End: ?

## 2024-05-19 NOTE — Progress Notes (Signed)
 "  Subjective:  Patient ID: Cathy Goodwin, female    DOB: 02/21/1956  Age: 68 y.o. MRN: [id number]  CC: Hypertension, Hyperlipidemia, Annual Exam, and Gastroesophageal Reflux   HPI Cathy Goodwin presents for a CPX and f/up ---  Discussed the use of AI scribe software for clinical note transcription with the patient, who gave verbal consent to proceed.  History of Present Illness Cathy Goodwin is a 68 year old female who presents with persistent fatigue and sleep disturbances.  She experiences persistent fatigue despite maintaining an active lifestyle and working extensively. Although she perceives she is getting enough sleep, a sleep app indicates she is not achieving REM sleep. She experiences snoring episodes but has not undergone treatment for sleep apnea. She occasionally takes Ambien , breaking it in half when sleep is particularly difficult, and also takes gabapentin  at night.  She has a history of osteoporosis and has not yet started Prolia  injections despite insurance approval. Concerns about side effects, particularly bone aches, have led her to avoid oral treatments due to acid reflux. She reports that her bone density test results are worsening.  She experiences frequent acid reflux and takes famotidine  regularly, with occasional use of omeprazole  if famotidine  is ineffective. Certain foods exacerbate her symptoms. She has a history of an endoscopy that resulted in a torn esophagus, leading to a six-day hospital stay. No significant difficulty swallowing, but she occasionally chokes on water.  She previously took Lipitor but discontinued it due to side effects. She has not tried other statins but is open to trying Livalo . She is not currently on any blood pressure medication but was previously on olmesartan  40 mg, which she ran out of three weeks ago and switched to losartan .  She is planning a cruise in October and is considering vaccinations for Hepatitis A and B. No painful  swallowing, significant weight loss, or choking, except occasionally with water.     Outpatient Medications Prior to Visit  Medication Sig Dispense Refill   ALPRAZolam  (XANAX ) 0.5 MG tablet Take 1 tablet (0.5 mg total) by mouth at bedtime as needed for anxiety. 90 tablet 1   azelastine  (ASTELIN ) 0.1 % nasal spray Place 2 sprays into both nostrils daily as needed for allergies. 90 mL 1   fluticasone  (FLONASE ) 50 MCG/ACT nasal spray Place 1 spray into both nostrils 2 (two) times daily. 16 g 2   gabapentin  (NEURONTIN ) 300 MG capsule TAKE 1 CAPSULE BY MOUTH EVERYDAY AT BEDTIME 90 capsule 1   HYDROcodone -acetaminophen  (NORCO/VICODIN) 5-325 MG tablet Take 1 tablet by mouth every 8 (eight) hours as needed.     montelukast  (SINGULAIR ) 10 MG tablet Take 1 tablet (10 mg total) by mouth daily as needed (allergies). 90 tablet 1   predniSONE  (STERAPRED UNI-PAK 21 TAB) 5 MG (21) TBPK tablet Take 5 mg by mouth as needed.     tiZANidine  (ZANAFLEX ) 2 MG tablet Take 2 mg by mouth every 6 (six) hours as needed.     traMADol  (ULTRAM -ER) 100 MG 24 hr tablet TAKE 1 TABLET (100 MG TOTAL) BY MOUTH DAILY AS NEEDED FOR PAIN. 90 tablet 0   zolpidem  (AMBIEN ) 10 MG tablet TAKE 0.5-1 TABLETS (5-10 MG TOTAL) BY MOUTH AT BEDTIME AS NEEDED. FOR SLEEP 90 tablet 0   famotidine  (PEPCID ) 40 MG tablet Take 1 tablet (40 mg total) by mouth daily. 90 tablet 1   omeprazole  (PRILOSEC  OTC) 20 MG tablet Take 1 tablet (20 mg total) by mouth daily. 90 tablet 1   acetaminophen  (TYLENOL ) 500  MG tablet Take 1 tablet (500 mg total) by mouth every 6 (six) hours as needed for moderate pain. 30 tablet 1   dicyclomine  (BENTYL ) 10 MG capsule Take 1 capsule (10 mg total) by mouth every 8 (eight) hours as needed for spasms. 30 capsule 1   methylcellulose (CITRUCEL) oral powder Take 1 packet by mouth daily.     Pitavastatin  Calcium  2 MG TABS Take 1 tablet (2 mg total) by mouth daily. (Patient not taking: Reported on 12/27/2023) 90 tablet 1   Vitamin D ,  Ergocalciferol , (DRISDOL ) 1.25 MG (50000 UNIT) CAPS capsule Take 1 capsule (50,000 Units total) by mouth every 7 (seven) days. 12 capsule 0   Facility-Administered Medications Prior to Visit  Medication Dose Route Frequency Provider Last Rate Last Admin   denosumab  (PROLIA ) injection 60 mg  60 mg Subcutaneous Once         ROS Review of Systems  Constitutional:  Positive for fatigue. Negative for appetite change, chills, diaphoresis, fever and unexpected weight change.  HENT: Negative.    Eyes: Negative.   Respiratory: Negative.  Negative for cough, chest tightness, shortness of breath and wheezing.   Cardiovascular:  Negative for chest pain, palpitations and leg swelling.  Gastrointestinal: Negative.  Negative for abdominal pain, blood in stool, constipation, diarrhea, nausea and vomiting.  Endocrine: Negative.   Genitourinary: Negative.  Negative for difficulty urinating and dysuria.  Musculoskeletal:  Positive for back pain. Negative for arthralgias and myalgias.  Skin: Negative.   Neurological:  Negative for dizziness and weakness.  Hematological:  Negative for adenopathy. Does not bruise/bleed easily.  Psychiatric/Behavioral:  Positive for sleep disturbance. The patient is nervous/anxious.     Objective:  BP (!) 144/86 (BP Location: Left Arm, Patient Position: Sitting, Cuff Size: Normal)   Pulse 70   Temp 97.8 F (36.6 C) (Oral)   Ht 5' 2 (1.575 m)   Wt 178 lb (80.7 kg)   SpO2 98%   BMI 32.56 kg/m   BP Readings from Last 3 Encounters:  05/19/24 (!) 144/86  12/27/23 (!) 150/96  12/11/23 118/68    Wt Readings from Last 3 Encounters:  05/19/24 178 lb (80.7 kg)  12/27/23 180 lb (81.6 kg)  12/11/23 174 lb 3.2 oz (79 kg)    Physical Exam Vitals reviewed.  Constitutional:      Appearance: Normal appearance.  HENT:     Nose: Nose normal.     Mouth/Throat:     Mouth: Mucous membranes are moist.  Eyes:     General: No scleral icterus.    Conjunctiva/sclera:  Conjunctivae normal.  Cardiovascular:     Rate and Rhythm: Normal rate and regular rhythm.     Heart sounds: No murmur heard.    No friction rub. No gallop.  Pulmonary:     Effort: Pulmonary effort is normal.     Breath sounds: No stridor. No wheezing, rhonchi or rales.  Abdominal:     General: Abdomen is flat and protuberant.     Palpations: There is no mass.     Tenderness: There is no abdominal tenderness. There is no guarding.     Hernia: No hernia is present.  Musculoskeletal:        General: Normal range of motion.     Cervical back: Neck supple.     Right lower leg: No edema.     Left lower leg: No edema.  Lymphadenopathy:     Cervical: No cervical adenopathy.  Skin:    General: Skin is dry.  Findings: No rash.  Neurological:     General: No focal deficit present.     Mental Status: She is alert.  Psychiatric:        Mood and Affect: Mood normal.        Behavior: Behavior normal.     Lab Results  Component Value Date   WBC 6.2 12/11/2023   HGB 14.0 12/11/2023   HCT 42.5 12/11/2023   PLT 293.0 12/11/2023   GLUCOSE 115 (H) 05/19/2024   CHOL 227 (H) 05/19/2024   TRIG 305.0 (H) 05/19/2024   HDL 56.60 05/19/2024   LDLCALC 110 (H) 05/19/2024   ALT 17 12/11/2023   AST 21 12/11/2023   NA 141 05/19/2024   K 4.2 05/19/2024   CL 106 05/19/2024   CREATININE 0.78 05/19/2024   BUN 16 05/19/2024   CO2 25 05/19/2024   TSH 0.65 12/11/2023   HGBA1C 6.4 05/19/2024    DG BONE DENSITY (DXA) Result Date: 01/04/2024 EXAM: DUAL X-RAY ABSORPTIOMETRY (DXA) FOR BONE MINERAL DENSITY IMPRESSION: Referring Physician:  DEBBY LITTIE MOLT Your patient completed a bone mineral density test using GE Lunar iDXA system (analysis version: 16). Technologist: KAT PATIENT: Name: Cathy Goodwin, Cathy Goodwin Patient ID: [id number] Birth Date: 1956/06/16 Height: 61.2 in. Sex: Female Measured: 01/03/2024 Weight: 175.6 lbs. Indications: Breast Cancer History, Caucasian, Estrogen Deficient, Family Hist. (Parent  hip fracture), Famotidine , Postmenopausal, Prilosec  Fractures: Vertebrae Treatments: Vitamin D  (E933.5) ASSESSMENT: The BMD measured at Femur Neck Left is 0.651 g/cm2 with a T-score of -2.8. This patient's diagnostic category is OSTEOPOROSIS according to World Health Organization [facility] ([facility])) criteria. The quality of the exam is good. L4 was excluded due to degenerative changes. Site Region Measured Date Measured Age YA BMD Significant CHANGE T-score DualFemur Neck Left  01/03/2024    66.2         -2.8    0.651 g/cm2 AP Spine  L1-L3      01/03/2024    66.2         -1.7    0.970 g/cm2 DualFemur Total Mean 01/03/2024    66.2         -2.5    0.691 g/cm2 World Health Organization [facility]) criteria for post-menopausal, Caucasian Women: Normal       T-score at or above -1 SD Osteopenia   T-score between -1 and -2.5 SD Osteoporosis T-score at or below -2.5 SD RECOMMENDATION: 1. All patients should optimize calcium  and vitamin D  intake. 2. Consider FDA-approved medical therapies in postmenopausal women and men aged 68 years and older, based on the following: a. A hip or vertebral (clinical or morphometric) fracture. b. T-score = -2.5 at the femoral neck or spine after appropriate evaluation to exclude secondary causes. c. Low bone mass (T-score between -1.0 and -2.5 at the femoral neck or spine) and a 10-year probability of a hip fracture = 3% or a 10-year probability of a major osteoporosis-related fracture = 20% based on the [place] -adapted WHO algorithm. d. Clinician judgment and/or patient preferences may indicate treatment for people with 10-year fracture probabilities above or below these levels. FOLLOW-UP: Patients with diagnosis of osteoporosis or at high risk for fracture should have regular bone mineral density tests.? Patients eligible for Medicare are allowed routine testing every 2 years.? The testing frequency can be increased to one year for patients who have rapidly progressing disease, are receiving or discontinuing  medical therapy to restore bone mass, or have additional risk factors. I have reviewed this study and agree with the findings. [facility]  Electronically Signed   By: Reyes Phi M.D.   On: 01/04/2024 07:17   Fibrosis 4 Score = 1.15  Fib-4 interpretation is not validated for people under 35 or over 62 years of age. However, scores under 2.0 are generally considered low risk.   Assessment & Plan:  Prediabetes -     Basic metabolic panel with GFR; Future -     Hemoglobin A1c; Future  Age-related osteoporosis without current pathological fracture -     Basic metabolic panel with GFR; Future -     Phosphorus; Future -     VITAMIN D  25 Hydroxy (Vit-D Deficiency, Fractures); Future  Primary hypertension -     Basic metabolic panel with GFR; Future -     Olmesartan  Medoxomil; Take 1 tablet (40 mg total) by mouth daily.  Dispense: 90 tablet; Refill: 1  Dyslipidemia, goal LDL below 70 -     Lipid panel; Future -     Pitavastatin  Calcium ; Take 1 tablet (2 mg total) by mouth daily.  Dispense: 90 tablet; Refill: 1  Vitamin D  deficiency disease- Will start Vit D 5000 units/day. -     VITAMIN D  25 Hydroxy (Vit-D Deficiency, Fractures); Future  Agatston CAC score 200-399- Will start a statin for CV risk reduction. -     Pitavastatin  Calcium ; Take 1 tablet (2 mg total) by mouth daily.  Dispense: 90 tablet; Refill: 1  Gastroesophageal reflux disease without esophagitis -     Esomeprazole  Magnesium ; Take 1 capsule (40 mg total) by mouth daily.  Dispense: 90 capsule; Refill: 1  Psychophysiological insomnia -     Belsomra ; Take 1 tablet (20 mg total) by mouth at bedtime as needed.  Dispense: 90 tablet; Refill: 1  Encounter for general adult medical examination with abnormal findings- Exam completed, labs reviewed, vaccines reviewed and updated, cancer screenings are UTD, pt ed material was given.   Immunization due -     Heplisav-B  (HepB-CPG) Vaccine -     Hepatitis A vaccine adult  IM     Follow-up: Return in about 6 months (around 11/19/2024).  Debby Molt, MD "

## 2024-05-19 NOTE — Patient Instructions (Signed)

## 2024-05-20 ENCOUNTER — Ambulatory Visit: Payer: Self-pay | Admitting: Internal Medicine

## 2024-05-20 MED ORDER — CHOLECALCIFEROL 125 MCG (5000 UT) PO CAPS: 5000.0000 [IU] | ORAL_CAPSULE | Freq: Every day | ORAL | 0 refills | Status: AC

## 2024-07-03 ENCOUNTER — Other Ambulatory Visit: Payer: Self-pay | Admitting: Internal Medicine

## 2024-07-03 DIAGNOSIS — Z1231 Encounter for screening mammogram for malignant neoplasm of breast: Secondary | ICD-10-CM

## 2024-07-11 ENCOUNTER — Ambulatory Visit
Admission: RE | Admit: 2024-07-11 | Discharge: 2024-07-11 | Disposition: A | Discharge status: Home / Self Care | Source: Ambulatory Visit | Attending: Internal Medicine | Admitting: Internal Medicine

## 2024-07-11 DIAGNOSIS — Z1231 Encounter for screening mammogram for malignant neoplasm of breast: Secondary | ICD-10-CM | POA: Diagnosis not present

## 2024-07-15 ENCOUNTER — Other Ambulatory Visit: Payer: Self-pay | Admitting: Internal Medicine

## 2024-07-15 DIAGNOSIS — G2581 Restless legs syndrome: Secondary | ICD-10-CM

## 2024-07-15 DIAGNOSIS — M48061 Spinal stenosis, lumbar region without neurogenic claudication: Secondary | ICD-10-CM

## 2024-07-15 DIAGNOSIS — M160 Bilateral primary osteoarthritis of hip: Secondary | ICD-10-CM

## 2024-07-16 ENCOUNTER — Other Ambulatory Visit: Payer: Self-pay | Admitting: Internal Medicine

## 2024-07-16 DIAGNOSIS — F411 Generalized anxiety disorder: Secondary | ICD-10-CM

## 2024-07-16 DIAGNOSIS — F5104 Psychophysiologic insomnia: Secondary | ICD-10-CM

## 2024-07-16 DIAGNOSIS — G2581 Restless legs syndrome: Secondary | ICD-10-CM

## 2024-10-28 ENCOUNTER — Other Ambulatory Visit: Payer: Self-pay | Admitting: Internal Medicine

## 2024-10-28 DIAGNOSIS — G2581 Restless legs syndrome: Secondary | ICD-10-CM

## 2024-10-28 DIAGNOSIS — M48061 Spinal stenosis, lumbar region without neurogenic claudication: Secondary | ICD-10-CM

## 2024-10-28 DIAGNOSIS — M160 Bilateral primary osteoarthritis of hip: Secondary | ICD-10-CM

## 2024-10-29 ENCOUNTER — Encounter: Payer: Self-pay | Admitting: *Deleted

## 2024-10-29 NOTE — Progress Notes (Signed)
 Cathy Goodwin                                          MRN: [id number]   10/29/2024   The VBCI Quality Team Specialist reviewed this patient medical record for the purposes of chart review for care gap closure. The following were reviewed: chart review for care gap closure-controlling blood pressure.    VBCI Quality Team

## 2024-10-30 ENCOUNTER — Other Ambulatory Visit (HOSPITAL_COMMUNITY): Payer: Self-pay

## 2024-10-30 ENCOUNTER — Telehealth: Payer: Self-pay

## 2024-10-30 NOTE — Telephone Encounter (Signed)
 Pharmacy Patient Advocate Encounter   Received notification from [facility] that prior authorization for Tramadol  ER 100 mg is required/requested.   Insurance verification completed.   The patient is insured through [facility].   Per test claim: PA required; PA submitted to above mentioned insurance via Latent Key/confirmation #/EOC [id number] Status is pending

## 2024-10-31 ENCOUNTER — Other Ambulatory Visit (HOSPITAL_COMMUNITY): Payer: Self-pay

## 2024-10-31 NOTE — Telephone Encounter (Signed)
 Pharmacy Patient Advocate Encounter  Received notification from [employer] that Prior Authorization for traMADol  HCl ER 100MG  er tablets  has been APPROVED from 10/30/2024 to 10/30/2025. Ran test claim, Copay is $15.00. This test claim was processed through [facility]- copay amounts may vary at other pharmacies due to pharmacy/plan contracts, or as the patient moves through the different stages of their insurance plan.   PA #/Case ID/Reference #: [id number]

## 2024-11-12 ENCOUNTER — Ambulatory Visit: Admitting: Internal Medicine
